# Patient Record
Sex: Male | Born: 1990 | Race: White | Hispanic: No | Marital: Single | Smoking: Never smoker
Health system: Southern US, Community
[De-identification: ages and names within clinical notes are randomized; demographics above are authoritative.]

## PROBLEM LIST (undated history)

## (undated) ENCOUNTER — Emergency Department (HOSPITAL_BASED_OUTPATIENT_CLINIC_OR_DEPARTMENT_OTHER): Payer: Self-pay

## (undated) DIAGNOSIS — K37 Unspecified appendicitis: Secondary | ICD-10-CM

## (undated) HISTORY — PX: APPENDECTOMY: SHX54

## (undated) HISTORY — PX: TONSILLECTOMY: SUR1361

## (undated) HISTORY — DX: Unspecified appendicitis: K37

---

## 2014-07-16 ENCOUNTER — Ambulatory Visit: Payer: BLUE CROSS/BLUE SHIELD

## 2014-07-19 ENCOUNTER — Ambulatory Visit (INDEPENDENT_AMBULATORY_CARE_PROVIDER_SITE_OTHER): Payer: BLUE CROSS/BLUE SHIELD

## 2014-07-19 ENCOUNTER — Ambulatory Visit (INDEPENDENT_AMBULATORY_CARE_PROVIDER_SITE_OTHER): Payer: BLUE CROSS/BLUE SHIELD | Admitting: Family Medicine

## 2014-07-19 VITALS — BP 138/76 | HR 88 | Temp 98.2°F | Resp 16 | Ht 72.0 in | Wt 278.0 lb

## 2014-07-19 DIAGNOSIS — Z0000 Encounter for general adult medical examination without abnormal findings: Secondary | ICD-10-CM

## 2014-07-19 DIAGNOSIS — M545 Low back pain, unspecified: Secondary | ICD-10-CM

## 2014-07-19 DIAGNOSIS — D224 Melanocytic nevi of scalp and neck: Secondary | ICD-10-CM

## 2014-07-19 DIAGNOSIS — F42 Obsessive-compulsive disorder: Secondary | ICD-10-CM

## 2014-07-19 DIAGNOSIS — Z23 Encounter for immunization: Secondary | ICD-10-CM

## 2014-07-19 DIAGNOSIS — F909 Attention-deficit hyperactivity disorder, unspecified type: Secondary | ICD-10-CM | POA: Insufficient documentation

## 2014-07-19 DIAGNOSIS — F429 Obsessive-compulsive disorder, unspecified: Secondary | ICD-10-CM | POA: Insufficient documentation

## 2014-07-19 DIAGNOSIS — E663 Overweight: Secondary | ICD-10-CM

## 2014-07-19 DIAGNOSIS — Z130 Encounter for screening for diseases of the blood and blood-forming organs and certain disorders involving the immune mechanism: Secondary | ICD-10-CM

## 2014-07-19 DIAGNOSIS — F411 Generalized anxiety disorder: Secondary | ICD-10-CM

## 2014-07-19 DIAGNOSIS — Z131 Encounter for screening for diabetes mellitus: Secondary | ICD-10-CM

## 2014-07-19 DIAGNOSIS — Z13220 Encounter for screening for lipoid disorders: Secondary | ICD-10-CM

## 2014-07-19 LAB — COMPREHENSIVE METABOLIC PANEL
ALT: 23 U/L (ref 0–53)
AST: 23 U/L (ref 0–37)
Albumin: 4.3 g/dL (ref 3.5–5.2)
Alkaline Phosphatase: 60 U/L (ref 39–117)
BILIRUBIN TOTAL: 0.7 mg/dL (ref 0.2–1.2)
BUN: 23 mg/dL (ref 6–23)
CHLORIDE: 106 meq/L (ref 96–112)
CO2: 27 mEq/L (ref 19–32)
Calcium: 9.4 mg/dL (ref 8.4–10.5)
Creat: 0.96 mg/dL (ref 0.50–1.35)
GLUCOSE: 79 mg/dL (ref 70–99)
POTASSIUM: 4.5 meq/L (ref 3.5–5.3)
Sodium: 140 mEq/L (ref 135–145)
Total Protein: 7.2 g/dL (ref 6.0–8.3)

## 2014-07-19 LAB — LDL CHOLESTEROL, DIRECT: Direct LDL: 102 mg/dL — ABNORMAL HIGH

## 2014-07-19 MED ORDER — MELOXICAM 15 MG PO TABS: 15.0000 mg | ORAL_TABLET | Freq: Every day | ORAL | Status: DC

## 2014-07-19 NOTE — Progress Notes (Addendum)
[facility] [address] [place] [address] [phone]  Date:  07/19/2014   Name:  James Peters   DOB:  07-20-1990   MRN:  [id number]  PCP:  No PCP Per Patient    Chief Complaint: Annual Exam   History of Present Illness:  James Peters is a 24 y.o. very pleasant male patient who presents with the following:  He is here today as a new patient- he would like a check up and to look at some moles, and to be set up with psychiatry. He has a mole on his left scalp that has been there "a long time"- it was under his hair which started to thin at age 78, he now wears a shaved hair style and this mole is more apparent.  He has never had any skin cancer himself, but there is some in his family.  He has not noted any change in the mole He is currently seeing a psychiatrist in [place]- however as he has moved here he would like to establish with someone here.  He is treated for ADHD, anxiety, OCD He works at [employer] and sometimes has some back pain after working. He has to lift a fair amount and admits he is not in very good shape.   He has had some trouble with back pain since he was in HS.  He does not have any leg pain, no weakness or numbness.    He is not fasting today.   He declines STI testing.  We will do a tetanus shot today Flu shot done already  There are no active problems to display for this patient.   History reviewed. No pertinent past medical history.  Past Surgical History  Procedure Laterality Date  . Appendectomy      History  Substance Use Topics  . Smoking status: Never Smoker   . Smokeless tobacco: Not on file  . Alcohol Use: Not on file    History reviewed. No pertinent family history.  No Known Allergies  Medication list has been reviewed and updated.  No current outpatient prescriptions on file prior to visit.   No current facility-administered medications on file prior to visit.    Review of Systems:  As per HPI-  otherwise negative.   Physical Examination: Filed Vitals:   07/19/14 1100  BP: 138/76  Pulse: 88  Temp: 98.2 F (36.8 C)  Resp: 16   Filed Vitals:   07/19/14 1100  Height: 6' (1.829 m)  Weight: 278 lb (126.1 kg)   Body mass index is 37.7 kg/(m^2). Ideal Body Weight: Weight in (lb) to have BMI = 25: 183.9  GEN: WDWN, NAD, Non-toxic, A & O x 3, obese, looks well HEENT: Atraumatic, Normocephalic. Neck supple. No masses, No LAD.  Bilateral TM wnl, oropharynx normal.  PEERL,EOMI.   There is a mole over pencil eraser sized with irregular border on his left anterior scalp.   Ears and Nose: No external deformity. CV: RRR, No M/G/R. No JVD. No thrill. No extra heart sounds. PULM: CTA B, no wheezes, crackles, rhonchi. No retractions. No resp. distress. No accessory muscle use. ABD: S, NT, ND, +BS. No rebound. No HSM. EXTR: No c/c/e NEURO Normal gait.  PSYCH: Normally interactive. Conversant. Not depressed or anxious appearing.  Calm demeanor.  He notes mild tenderness over his lower back.  Normal BLE strength, sensation and DTR, negative SLR  [facility] reading (PRIMARY) by  Dr. Lorelei Pont. Lumbar spine: negative  LUMBAR SPINE -  COMPLETE 4+ VIEW  COMPARISON: None.  FINDINGS: Bilateral L5 spondylolysis. No spondylolisthesis. Normal alignment. Normal mineralization. Paraspinal soft tissues normal.  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 Normal alignment.  Assessment and Plan: Physical exam  Nevus of scalp - Plan: Ambulatory referral to Dermatology  Generalized anxiety disorder - Plan: Ambulatory referral to Psychiatry  Screening for hyperlipidemia - Plan: LDL cholesterol, direct  Screening for diabetes mellitus - Plan: Comprehensive metabolic panel  Screening for deficiency anemia - Plan: CBC  Immunization due - Plan: Tdap vaccine greater than or equal to 7yo IM  Midline low back pain without sciatica - Plan: DG Lumbar Spine Complete, meloxicam (MOBIC)  15 MG tablet, DISCONTINUED: meloxicam (MOBIC) 15 MG tablet  CPE as above, await labs and will follow-up with him Referral to derm and to psychiatry Encouraged back/ core strengthening and general conditioning.  He can use mobic as needed.  Declines PT for now- he will let me know if not getting better  Signed Lamar Blinks, MD  Called 1/15 and [facility],.  Labs look good and I will send him a copy of labs.  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  He should work on weight loss and fitness but avoid all types of back extension exercises.  Will try back later to discuss  Called 1/17- he was actually aware of the spondylolysis, was diagnosed when he was a teen and he does not have any other questions about this.  Will send him a copy of his labs and x-ray report, as well as a short pt centered article regarding spondylolysis

## 2014-07-19 NOTE — Patient Instructions (Addendum)
I will be in touch with your labs asap We will set you up to see dermatology- let me know if you do not hear about this appt Please contact the psychiatrist of your choice and make an appt.  Here are a couple of suggestions  Dr. Talmage Coin, MD ?  Address: [address] [place], [place] [address]  [phone]  [facility] ?  Address: [address] [place], [place] [address]  [phone]  Your back films look good.  I would suggest that you work on your diet and exercise to try and drop some pounds/ strengthen your core.   Let me know if your back does not improve and try the mobic as needed for pain

## 2014-07-20 ENCOUNTER — Encounter: Payer: Self-pay | Admitting: Family Medicine

## 2014-07-20 LAB — CBC
HCT: 42.1 % (ref 39.0–52.0)
HEMOGLOBIN: 14.8 g/dL (ref 13.0–17.0)
MCH: 30.6 pg (ref 26.0–34.0)
MCHC: 35.2 g/dL (ref 30.0–36.0)
MCV: 87.2 fL (ref 78.0–100.0)
MPV: 10.5 fL (ref 8.6–12.4)
PLATELETS: 239 10*3/uL (ref 150–400)
RBC: 4.83 MIL/uL (ref 4.22–5.81)
RDW: 12.2 % (ref 11.5–15.5)
WBC: 4.4 10*3/uL (ref 4.0–10.5)

## 2015-03-19 ENCOUNTER — Ambulatory Visit (INDEPENDENT_AMBULATORY_CARE_PROVIDER_SITE_OTHER): Payer: 59 | Admitting: Psychiatry

## 2015-03-19 ENCOUNTER — Encounter (HOSPITAL_COMMUNITY): Payer: Self-pay | Admitting: Psychiatry

## 2015-03-19 VITALS — BP 141/79 | HR 100 | Ht 72.0 in | Wt 300.0 lb

## 2015-03-19 DIAGNOSIS — F900 Attention-deficit hyperactivity disorder, predominantly inattentive type: Secondary | ICD-10-CM | POA: Diagnosis not present

## 2015-03-19 DIAGNOSIS — F411 Generalized anxiety disorder: Secondary | ICD-10-CM

## 2015-03-19 DIAGNOSIS — F419 Anxiety disorder, unspecified: Secondary | ICD-10-CM | POA: Diagnosis not present

## 2015-03-19 MED ORDER — CLONIDINE HCL 0.1 MG PO TABS: 0.1000 mg | ORAL_TABLET | Freq: Every day | ORAL | Status: DC

## 2015-03-19 MED ORDER — PAROXETINE HCL 10 MG PO TABS: 10.0000 mg | ORAL_TABLET | Freq: Every day | ORAL | Status: DC

## 2015-03-19 MED ORDER — CLONAZEPAM 1 MG PO TABS: 1.0000 mg | ORAL_TABLET | Freq: Two times a day (BID) | ORAL | Status: DC | PRN

## 2015-03-19 MED ORDER — METHYLPHENIDATE HCL ER (OSM) 36 MG PO TBCR: 36.0000 mg | EXTENDED_RELEASE_TABLET | ORAL | Status: DC

## 2015-03-19 NOTE — Progress Notes (Signed)
[facility] Behavioral Health Initial Assessment Note  James Peters [id number] 24 y.o.  03/19/2015 9:59 AM  Chief Complaint:  I need my medication.  I was diagnosed with ADHD and OCD.  I have been taking this medication for more than 20 years.  My psychiatrist is out of my network.  History of Present Illness:  James Peters is 24 year old, Caucasian, single, employed man who is self-referred seeking management office psychiatric illness.  He's been diagnosed with ADHD and anxiety disorder and he's been taking medication for ADHD for more than 20 years.  He had formal psychological testing when he was in the school because he has difficulty doing multitasking, inattentive, hyperactivity and he was given a stimulant which has been working very well.  In recent years he remember having a lot of anxiety symptoms when he worried about his health, future, and having racing thoughts, insomnia and feeling overwhelmed.  Though he was diagnosed with OCD but do not recall any obsessive or ritual thoughts.  He was given Klonopin and Paxil few years ago by his psychiatrist in [place] and since then his symptoms are under control.  Lately he was seeing Debbora Dus however he switched his job and his insurance does not cover his current psychiatrist.  James Peters endorse though his anxiety and ADHD symptoms are under control but he is still have some time poor sleep, racing thoughts, excessive worrying and lack of energy.  He is able to do his task but he still struggle some time doing multitasking.  His attention and focus is okay.  He has gained weight because he is not watching his calorie intake or doing any regular exercise.  James Peters denies any feeling of hopelessness or worthlessness.  He denies any paranoia, hallucination, mania or any psychosis.  He is open to reduce the dose of stimulant since.  Discuss that stimulant may cause insomnia.  Since taking his medication he denies any major panic attack but admitted  sometime feeling overwhelmed.  He denies any impulsive behavior, OCD symptoms, nightmares, flashback or any PTSD symptoms.  He has no tremors or shakes.  He admitted social drinking but denies any binge or any intoxication.  James Peters lives with the roommate.  His family lives in [place].  James Peters has a close contact with his parents and his sister.  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  He likes his job and he does not like traveling every day 40-45 minutes.  He is open to her just is a stimulant dosage.  Suicidal Ideation: No Plan Formed: No James Peters has means to carry out plan: No  Homicidal Ideation: No Plan Formed: No James Peters has means to carry out plan: No  Past Psychiatric History/Hospitalization(s): James Peters diagnosed with ADHD when he was in school.  He has formal psychological testing.  He's been taking stimulant since then and in recent years he developed anxiety symptoms and he was given Klonopin and Paxil.  James Peters denies any history of psychiatric inpatient treatment, paranoia, hallucination, suicidal attempt, mania or any psychosis. Anxiety: Yes Bipolar Disorder: No Depression: Yes Mania: No Psychosis: No Schizophrenia: No Personality Disorder: No Hospitalization for psychiatric illness: No History of Electroconvulsive Shock Therapy: No Prior Suicide Attempts: No  Medical History; James Peters has no active medical problem.  He denies any history of seizures, headaches.  His primary care physician is Dr. Silvestre Mesi.  He had blood work in January 2016.  He has borderline LDL.  Traumatic brain injury: James Peters denies any history of  traumatic brain injury.  Family History; James Peters endorse her grandmother has diagnosed with schizophrenia.  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  He remember his grades are either average or above average.  He is currently working as a Therapist, occupational in a call center for [employer].  Psychosocial History; James Peters born and raised in [place].  His parents are married and living together.  James Peters has a very good relationship with his parents and his sister who lives in [place].  James Peters is single and currently not in any relationship.  He has no children.  He is living with roommates.  Legal History; James Peters denies any legal issues.  History Of Abuse; James Peters denies any history of abuse.  Substance Abuse History; James Peters admitted history of social drinking which is one beer and one to 2 months.  He denies drinking heavily, intoxication, blackouts or any seizures.  He denies any illegal substance use.    Review of Systems: Psychiatric: Agitation: No Hallucination: No Depressed Mood: No Insomnia: Yes Hypersomnia: No Altered Concentration: No Feels Worthless: No Grandiose Ideas: No Belief In Special Powers: No New/Increased Substance Abuse: No Compulsions: No  Neurologic: Headache: No Seizure: No Paresthesias: No   Outpatient Encounter Prescriptions as of 03/19/2015  Medication Sig  . clonazePAM (KLONOPIN) 1 MG tablet Take 1 tablet (1 mg total) by mouth 2 (two) times daily as needed for anxiety.  . cloNIDine (CATAPRES) 0.1 MG tablet Take 1 tablet (0.1 mg total) by mouth at bedtime.  . methylphenidate 36 MG PO CR tablet Take 1 tablet (36 mg total) by mouth every morning.  Marland Kitchen PARoxetine (PAXIL) 10 MG tablet Take 1 tablet (10 mg total) by mouth daily.  . [DISCONTINUED] clonazePAM (KLONOPIN) 1 MG tablet Take 1 mg by mouth 2 (two) times daily.  . [DISCONTINUED] cloNIDine (CATAPRES) 0.1 MG tablet Take 0.1 mg by mouth 2 (two) times daily.  . [DISCONTINUED] meloxicam (MOBIC) 15 MG tablet Take 1 tablet (15 mg total) by mouth daily. Use as needed for back pain (James Peters not taking: Reported on 03/19/2015)  . [DISCONTINUED] methylphenidate 54 MG PO CR tablet Take 54 mg by mouth every morning.  . [DISCONTINUED] PARoxetine (PAXIL) 10 MG tablet Take 10  mg by mouth daily.   No facility-administered encounter medications on file as of 03/19/2015.    No results found for this or any previous visit (from the past 2160 hour(s)).    Constitutional:  BP 141/79 mmHg  Pulse 100  Ht 6' (1.829 m)  Wt 300 lb (136.079 kg)  BMI 40.68 kg/m2   Musculoskeletal: Strength & Muscle Tone: within normal limits Gait & Station: normal James Peters leans: N/A  Psychiatric Specialty Exam: General Appearance: Casual  Eye Contact::  Fair  Speech:  Normal Rate  Volume:  Normal  Mood:  Anxious  Affect:  Appropriate  Thought Process:  Coherent and Goal Directed  Orientation:  Full (Time, Place, and Person)  Thought Content:  WDL  Suicidal Thoughts:  No  Homicidal Thoughts:  No  Memory:  Immediate;   Good Recent;   Good Remote;   Good  Judgement:  Good  Insight:  Good  Psychomotor Activity:  Normal  Concentration:  Good  Recall:  Good  Fund of Knowledge:  Good  Language:  Good  Akathisia:  No  Handed:  Right  AIMS (if indicated):     Assets:  Communication Skills Desire for Improvement Financial Resources/Insurance [place] Talents/Skills Transportation  ADL's:  Intact  Cognition:  WNL  Sleep:        Established Problem, Stable/Improving (1), New problem, with additional work up planned, Review of Psycho-Social Stressors (1), Review or order clinical lab tests (1), Review and summation of old records (2), New Problem, with no additional work-up planned (3), Independent Review of image, tracing or specimen (2) and Review of Medication Regimen & Side Effects (2)  Assessment: Axis I: ADHD, inattentive type.  Anxiety disorder NOS  Axis II: Deferred  Axis III:  History reviewed. No pertinent past medical history.   Plan:  I review her symptoms, history, current medication, blood work results and collateral information.  He is taking Concerta 54 mg every day, clonidine 0.1 milligram at bedtime,  Klonopin 1 mg twice a day and Paxil 10 mg daily.  I had a long discussion with the James Peters about dosage, indication, interaction and side effects of medication.  We discuss stimulant cause anxiety symptoms and insomnia.  Recommended to decrease stimulant dose which may help to reduce taking his Klonopin and eventually he may come off from Paxil.  James Peters is willing to try low-dose Concerta.  He will start 36 mg daily and he will also use Klonopin 0.5-1 mg as needed twice a day.  For now he will continue Klonopin 0.1 mg at bedtime and Paxil 10 mg daily but we will consider reducing the dose on his next appointment.  He also encouraged to watch his calorie intake, and to regular exercise.  Discussed medication side effects.  At this time James Peters does not want counseling however agree if needed that he will call us back.  Discuss safety plan that anytime having active suicidal thoughts or homicidal thoughts and he need to call 911 or go to the local emergency room.  I will see him again in 3 weeks.  We will get records from [facility] for collateral information.  Opaline Reyburn T., MD 03/19/2015

## 2015-04-10 ENCOUNTER — Ambulatory Visit (INDEPENDENT_AMBULATORY_CARE_PROVIDER_SITE_OTHER): Payer: 59 | Admitting: Psychiatry

## 2015-04-10 ENCOUNTER — Encounter (HOSPITAL_COMMUNITY): Payer: Self-pay | Admitting: Psychiatry

## 2015-04-10 VITALS — BP 128/78 | HR 96 | Ht 72.0 in | Wt 307.6 lb

## 2015-04-10 DIAGNOSIS — F411 Generalized anxiety disorder: Secondary | ICD-10-CM

## 2015-04-10 DIAGNOSIS — F900 Attention-deficit hyperactivity disorder, predominantly inattentive type: Secondary | ICD-10-CM

## 2015-04-10 MED ORDER — METHYLPHENIDATE HCL ER (OSM) 36 MG PO TBCR: 36.0000 mg | EXTENDED_RELEASE_TABLET | ORAL | Status: DC

## 2015-04-10 MED ORDER — CLONIDINE HCL 0.1 MG PO TABS: 0.1000 mg | ORAL_TABLET | Freq: Every day | ORAL | Status: DC

## 2015-04-10 MED ORDER — PAROXETINE HCL 10 MG PO TABS: 10.0000 mg | ORAL_TABLET | Freq: Every day | ORAL | Status: DC

## 2015-04-10 MED ORDER — CLONAZEPAM 1 MG PO TABS: 1.0000 mg | ORAL_TABLET | Freq: Two times a day (BID) | ORAL | Status: DC | PRN

## 2015-04-10 NOTE — Progress Notes (Signed)
James Peters Progress Note  James Peters [id number] 24 y.o.  04/10/2015 9:17 AM  Chief Complaint:  I cut down Concerta and it is working fine.  My ADHD symptoms are under control.  I still have anxiety.    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  He has diagnoses of ADHD, anxiety disorder and OCD.  He is taking medication for more than 20 years .  He was seeing Debbora Dus and recently patient switch his job and his insurance does not cover him.  He was taking Concerta 54 mg, clonidine 0.1 mg at bedtime, Klonopin 1 mg twice a day and Paxil 10 mg daily.  He was awaiting of poor sleep, racing thoughts, excessive worrying and anxiety.  We have recommended to cut down his Concerta and now he is taking low dose Concerta.  He seen much improvement in his sleep, racing thoughts but he still have anxiety .  He tried cutting down his Klonopin and there are times when he takes half tablet on the weekend but get very anxious and nervous.  Recently he moved from [place] to [place] closer to his job.  He is living by himself.  He is very happy because he does not like living with roommates.  He is able to do multitasking.  Recently his employer given him more job and task.  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  He has no tremors, shakes, irritability or any nightmares.  He does not have any OCD symptoms.  He wants to keep Paxil for now because it is working his anxiety and Klonopin 1 mg twice a day.  We are still awaiting collateral information from Debbora Dus who was his previous psychiatrist.  Patient denies drinking or using any illegal substances.  He has not joined the [facility] yet but planning in few days.  His appetite is okay.  He has gained some weight from the past and is hoping to watch his calories in  the future.    Suicidal Ideation: No Plan Formed: No Patient has means to carry out plan: No  Homicidal Ideation: No Plan Formed: No Patient has means to carry out plan: No  Past Psychiatric History/Hospitalization(s): Patient diagnosed with ADHD when he was in school.  He has formal psychological testing.  He is taking stimulant since his his school age.  In the past few years he developed anxiety symptoms and he was given Klonopin and Paxil.  He was seeing Debbora Dus .  Patient denies any history of psychiatric inpatient treatment, paranoia, hallucination, suicidal attempt, mania or any psychosis. Anxiety: Yes Bipolar Disorder: No Depression: Yes Mania: No Psychosis: No Schizophrenia: No Personality Disorder: No Hospitalization for psychiatric illness: No History of Electroconvulsive Shock Therapy: No Prior Suicide Attempts: No  Medical History; Patient has no active medical problem.  He denies any history of seizures, headaches.  His primary care physician is Dr. Silvestre Mesi.  He had blood work in January 2016.  He has borderline LDL.  Family History; Patient endorse her grandmother has diagnosed with schizophrenia.  Psychosocial History; Patient born and raised in [place].  His parents are married and living together.  Patient has a very good relationship with his parents and his sister who lives in [place].  Patient is single and currently not  in any relationship.  He has no children.  He lives by himself.    Substance Abuse History; Patient admitted history of social drinking which is one beer and one to 2 months.  He denies drinking heavily, intoxication, blackouts or any seizures.  He denies any illegal substance use.    Review of Systems  Constitutional: Negative for weight loss.  Cardiovascular: Negative for chest pain and palpitations.  Musculoskeletal: Negative.   Skin: Negative for itching and rash.  Neurological: Negative for dizziness, tingling,  tremors and headaches.  Psychiatric/Behavioral: Negative.     Psychiatric: Agitation: No Hallucination: No Depressed Mood: No Insomnia: No Hypersomnia: No Altered Concentration: No Feels Worthless: No Grandiose Ideas: No Belief In Special Powers: No New/Increased Substance Abuse: No Compulsions: No  Neurologic: Headache: No Seizure: No Paresthesias: No   Outpatient Encounter Prescriptions as of 04/10/2015  Medication Sig  . clonazePAM (KLONOPIN) 1 MG tablet Take 1 tablet (1 mg total) by mouth 2 (two) times daily as needed for anxiety.  . cloNIDine (CATAPRES) 0.1 MG tablet Take 1 tablet (0.1 mg total) by mouth at bedtime.  . methylphenidate 36 MG PO CR tablet Take 1 tablet (36 mg total) by mouth every morning.  Marland Kitchen PARoxetine (PAXIL) 10 MG tablet Take 1 tablet (10 mg total) by mouth daily.  . [DISCONTINUED] clonazePAM (KLONOPIN) 1 MG tablet Take 1 tablet (1 mg total) by mouth 2 (two) times daily as needed for anxiety.  . [DISCONTINUED] cloNIDine (CATAPRES) 0.1 MG tablet Take 1 tablet (0.1 mg total) by mouth at bedtime.  . [DISCONTINUED] methylphenidate 36 MG PO CR tablet Take 1 tablet (36 mg total) by mouth every morning.  . [DISCONTINUED] methylphenidate 36 MG PO CR tablet Take 1 tablet (36 mg total) by mouth every morning.  . [DISCONTINUED] PARoxetine (PAXIL) 10 MG tablet Take 1 tablet (10 mg total) by mouth daily.   No facility-administered encounter medications on file as of 04/10/2015.    No results found for this or any previous visit (from the past 2160 hour(s)).    Constitutional:  BP 128/78 mmHg  Pulse 96  Ht 6' (1.829 m)  Wt 307 lb 9.6 oz (139.526 kg)  BMI 41.71 kg/m2   Musculoskeletal: Strength & Muscle Tone: within normal limits Gait & Station: normal Patient leans: N/A  Psychiatric Specialty Exam: General Appearance: Casual  Eye Contact::  Fair  Speech:  Normal Rate  Volume:  Normal  Mood:  Euthymic  Affect:  Appropriate  Thought Process:   Coherent and Goal Directed  Orientation:  Full (Time, Place, and Person)  Thought Content:  WDL  Suicidal Thoughts:  No  Homicidal Thoughts:  No  Memory:  Immediate;   Good Recent;   Good Remote;   Good  Judgement:  Good  Insight:  Good  Psychomotor Activity:  Normal  Concentration:  Good  Recall:  Good  Fund of Knowledge:  Good  Language:  Good  Akathisia:  No  Handed:  Right  AIMS (if indicated):     Assets:  Communication Skills Desire for Improvement Financial Resources/Insurance Housing Physical Health Resilience Social Support Talents/Skills Transportation  ADL's:  Intact  Cognition:  WNL  Sleep:        Established Problem, Stable/Improving (1), Review of Psycho-Social Stressors (1), Decision to obtain old records (1), Review and summation of old records (2), Review of Last Therapy Session (1), Review of Medication Regimen & Side Effects (2) and Review of New Medication or Change in Dosage (2)  Assessment: Axis  I: ADHD, inattentive type.  Anxiety disorder NOS  Axis II: Deferred  Axis III:  History reviewed. No pertinent past medical history.   Plan:  We are still awaiting collateral information from his previous provider.  He likes low-dose Concerta .  He tried cutting down Klonopin but his anxiety get worse.  Discussed medication side effects and benefits.  Recommended to continue to try Klonopin half to 1 mg twice a day as needed to avoid building tolerance and dependency.  Discuss in detail benzodiazepine dependence, withdrawal and tolerance.  Continue Concerta 36 mg daily.  Continue clonidine 0.1 mg at bedtime and Paxil 10 mg daily.  Patient does not want to stop Paxil for now because of residual anxiety symptoms.  Discussed medication side effects and benefits in detail.  Encouraged to start [employer] and to watch his calorie intake .  Patient has gained weight from the past and he is aware and will work on his goals his weight.  Patient denies any side effects  including any shakes, tremors, headaches or any insomnia.  Recommended to call us back if he has any question or any concern.  Follow-up in 2 months.  Discuss safety plan that anytime having active suicidal thoughts or homicidal thoughts then 80 to call 911 or go to the local emergency room.  ARFEEN,SYED T., MD 04/10/2015

## 2015-05-27 ENCOUNTER — Encounter: Payer: Self-pay | Admitting: Emergency Medicine

## 2015-05-27 ENCOUNTER — Ambulatory Visit (INDEPENDENT_AMBULATORY_CARE_PROVIDER_SITE_OTHER): Payer: 59 | Admitting: Emergency Medicine

## 2015-05-27 VITALS — BP 134/84 | HR 112 | Temp 98.1°F | Resp 20 | Ht 73.0 in | Wt 313.8 lb

## 2015-05-27 DIAGNOSIS — J0140 Acute pansinusitis, unspecified: Secondary | ICD-10-CM

## 2015-05-27 MED ORDER — PSEUDOEPHEDRINE-GUAIFENESIN ER 60-600 MG PO TB12: 1.0000 | ORAL_TABLET | Freq: Two times a day (BID) | ORAL | Status: DC

## 2015-05-27 MED ORDER — AMOXICILLIN-POT CLAVULANATE 875-125 MG PO TABS: 1.0000 | ORAL_TABLET | Freq: Two times a day (BID) | ORAL | Status: DC

## 2015-05-27 NOTE — Progress Notes (Signed)
Subjective:  Patient ID: James Peters, male    DOB: 12-28-1990  Age: 24 y.o. MRN: [id number]  CC: Sinusitis   HPI James Peters presents  congestion postnasal drainage and a purulent nasal drainage. No cough wheezing or shortness of breath. No nausea vomiting. No stool change. No rash. No improvement with over-the-counter medication been ill for the last 3 days. He has a rather persistent frontal headache and pressure in his cheeks.  History James Peters has no past medical history on file.   He has past surgical history that includes Appendectomy and Tonsillectomy.   His  family history is not on file.  He   reports that he has never smoked. He does not have any smokeless tobacco history on file. His alcohol and drug histories are not on file.  Outpatient Prescriptions Prior to Visit  Medication Sig Dispense Refill  . clonazePAM (KLONOPIN) 1 MG tablet Take 1 tablet (1 mg total) by mouth 2 (two) times daily as needed for anxiety. 60 tablet 1  . cloNIDine (CATAPRES) 0.1 MG tablet Take 1 tablet (0.1 mg total) by mouth at bedtime. 30 tablet 1  . methylphenidate 36 MG PO CR tablet Take 1 tablet (36 mg total) by mouth every morning. 30 tablet 0  . PARoxetine (PAXIL) 10 MG tablet Take 1 tablet (10 mg total) by mouth daily. 30 tablet 1   No facility-administered medications prior to visit.    Social History   Social History  . Marital Status: Single    Spouse Name: N/A  . Number of Children: N/A  . Years of Education: N/A   Social History Main Topics  . Smoking status: Never Smoker   . Smokeless tobacco: None  . Alcohol Use: None  . Drug Use: None  . Sexual Activity: Not Asked   Other Topics Concern  . None   Social History Narrative     Review of Systems  Constitutional: Positive for fatigue. Negative for fever, chills and appetite change.  HENT: Positive for congestion, postnasal drip and rhinorrhea. Negative for ear pain, sinus pressure and sore throat.   Eyes:  Negative for pain and redness.  Respiratory: Negative for cough, shortness of breath and wheezing.   Cardiovascular: Negative for leg swelling.  Gastrointestinal: Negative for nausea, vomiting, abdominal pain, diarrhea, constipation and blood in stool.  Endocrine: Negative for polyuria.  Genitourinary: Negative for dysuria, urgency, frequency and flank pain.  Musculoskeletal: Negative for gait problem.  Skin: Negative for rash.  Neurological: Positive for headaches. Negative for weakness.  Psychiatric/Behavioral: Negative for confusion and decreased concentration. The patient is not nervous/anxious.     Objective:  BP 134/84 mmHg  Pulse 112  Temp(Src) 98.1 F (36.7 C) (Oral)  Resp 20  Ht 6\' 1"  (1.854 m)  Wt 313 lb 12.8 oz (142.339 kg)  BMI 41.41 kg/m2  SpO2 97%  Physical Exam  Constitutional: He is oriented to person, place, and time. He appears well-developed and well-nourished.  HENT:  Head: Normocephalic and atraumatic.  Eyes: Conjunctivae are normal. Pupils are equal, round, and reactive to light.  Pulmonary/Chest: Effort normal.  Musculoskeletal: He exhibits no edema.  Neurological: He is alert and oriented to person, place, and time.  Skin: Skin is dry.  Psychiatric: He has a normal mood and affect. His behavior is normal. Thought content normal.      Assessment & Plan:   James Peters was seen today for sinusitis.  Diagnoses and all orders for this visit:  Acute pansinusitis, recurrence not specified  Other orders -     amoxicillin-clavulanate (AUGMENTIN) 875-125 MG tablet; Take 1 tablet by mouth 2 (two) times daily. -     pseudoephedrine-guaifenesin (MUCINEX D) 60-600 MG 12 hr tablet; Take 1 tablet by mouth every 12 (twelve) hours.  I am having James Peters start on amoxicillin-clavulanate and pseudoephedrine-guaifenesin. I am also having him maintain his cloNIDine, PARoxetine, clonazePAM, and methylphenidate.  Meds ordered this encounter  Medications  .  amoxicillin-clavulanate (AUGMENTIN) 875-125 MG tablet    Sig: Take 1 tablet by mouth 2 (two) times daily.    Dispense:  20 tablet    Refill:  0  . pseudoephedrine-guaifenesin (MUCINEX D) 60-600 MG 12 hr tablet    Sig: Take 1 tablet by mouth every 12 (twelve) hours.    Dispense:  18 tablet    Refill:  0    Appropriate red flag conditions were discussed with the patient as well as actions that should be taken.  Patient expressed his understanding.  Follow-up: Return if symptoms worsen or fail to improve.  Roselee Culver, MD

## 2015-05-27 NOTE — Patient Instructions (Signed)

## 2015-06-11 ENCOUNTER — Ambulatory Visit (INDEPENDENT_AMBULATORY_CARE_PROVIDER_SITE_OTHER): Payer: 59 | Admitting: Psychiatry

## 2015-06-11 ENCOUNTER — Encounter (HOSPITAL_COMMUNITY): Payer: Self-pay | Admitting: Psychiatry

## 2015-06-11 VITALS — BP 127/84 | HR 88 | Ht 73.0 in | Wt 310.6 lb

## 2015-06-11 DIAGNOSIS — F411 Generalized anxiety disorder: Secondary | ICD-10-CM | POA: Diagnosis not present

## 2015-06-11 DIAGNOSIS — F900 Attention-deficit hyperactivity disorder, predominantly inattentive type: Secondary | ICD-10-CM | POA: Diagnosis not present

## 2015-06-11 MED ORDER — CLONAZEPAM 1 MG PO TABS: ORAL_TABLET | ORAL | Status: DC

## 2015-06-11 MED ORDER — METHYLPHENIDATE HCL ER (OSM) 36 MG PO TBCR: 36.0000 mg | EXTENDED_RELEASE_TABLET | ORAL | Status: DC

## 2015-06-11 MED ORDER — PAROXETINE HCL 20 MG PO TABS: 20.0000 mg | ORAL_TABLET | Freq: Every day | ORAL | Status: DC

## 2015-06-11 MED ORDER — CLONIDINE HCL 0.1 MG PO TABS: 0.1000 mg | ORAL_TABLET | Freq: Every day | ORAL | Status: DC

## 2015-06-11 NOTE — Progress Notes (Signed)
[facility] Progress Note  Marvelle Collinsworth [id number] 24 y.o.  06/11/2015 11:40 AM  Chief Complaint:  Medication management and follow-up.      History of Present Illness:  Gleen came for his follow-up appointment.  He tried to cut down Klonopin 1.5 mg a day but he gets sometimes very anxious and nervous.  Overall he is happy because his job is going very well.  He may get promoted in few weeks.  He had a good Thanksgiving.  He denies any major panic attack.  He denies any irritability, anger, mood swing.  He is able to do multitasking with his ADHD medication.  He denies any paranoia or any hallucination.  He lost 3 pounds since the last visit and is happy about it.  He started watching his diet and sometime he gets time that he do exercise and walking.  He started [facility] .  We are still awaiting collateral information from his previous provider.  Patient denies drinking or using any illegal substances.  He has no chest pain or any palpitation.  He does not ask for early refills of Klonopin.  Patient denies drinking or using any illegal substances.  He is hoping to spend Christmas with his family in [place].  Patient is working in [employer].  He lives by himself.Marland Kitchen  He is single and he has no children.  Suicidal Ideation: No Plan Formed: No Patient has means to carry out plan: No  Homicidal Ideation: No Plan Formed: No Patient has means to carry out plan: No  Past Psychiatric History/Hospitalization(s): Patient diagnosed with ADHD when he was in school.  He has formal psychological testing.  He is taking stimulant since his his school age.  In the past few years he developed anxiety symptoms and he was given Klonopin and Paxil.  He was seeing Debbora Dus .  Patient denies any history of psychiatric inpatient treatment, paranoia, hallucination, suicidal attempt, mania or any psychosis. Anxiety: Yes Bipolar Disorder: No Depression: Yes Mania: No Psychosis:  No Schizophrenia: No Personality Disorder: No Hospitalization for psychiatric illness: No History of Electroconvulsive Shock Therapy: No Prior Suicide Attempts: No  Medical History; Patient has no active medical problem.  He denies any history of seizures, headaches.  His primary care physician is Dr. Silvestre Mesi.  He had blood work in January 2016.  He has borderline LDL.  Family History; Patient endorse her grandmother has diagnosed with schizophrenia.  Substance Abuse History; Patient admitted history of social drinking which is one beer and one to 2 months.  He denies drinking heavily, intoxication, blackouts or any seizures.  He denies any illegal substance use.    Review of Systems  Constitutional: Positive for weight loss.  Cardiovascular: Negative for chest pain and palpitations.  Musculoskeletal: Negative.   Skin: Negative for itching and rash.  Neurological: Negative for dizziness, tingling, tremors and headaches.  Psychiatric/Behavioral: Negative.  Negative for depression, suicidal ideas and substance abuse.    Psychiatric: Agitation: No Hallucination: No Depressed Mood: No Insomnia: No Hypersomnia: No Altered Concentration: No Feels Worthless: No Grandiose Ideas: No Belief In Special Powers: No New/Increased Substance Abuse: No Compulsions: No  Neurologic: Headache: No Seizure: No Paresthesias: No   Outpatient Encounter Prescriptions as of 06/11/2015  Medication Sig  . amoxicillin-clavulanate (AUGMENTIN) 875-125 MG tablet Take 1 tablet by mouth 2 (two) times daily.  . clonazePAM (KLONOPIN) 1 MG tablet Take 1 tab daily and 1/2 at bed time  . cloNIDine (CATAPRES) 0.1 MG  tablet Take 1 tablet (0.1 mg total) by mouth at bedtime.  . methylphenidate 36 MG PO CR tablet Take 1 tablet (36 mg total) by mouth every morning.  Marland Kitchen PARoxetine (PAXIL) 20 MG tablet Take 1 tablet (20 mg total) by mouth daily.  . [DISCONTINUED] clonazePAM (KLONOPIN) 1 MG tablet Take 1  tablet (1 mg total) by mouth 2 (two) times daily as needed for anxiety.  . [DISCONTINUED] cloNIDine (CATAPRES) 0.1 MG tablet Take 1 tablet (0.1 mg total) by mouth at bedtime.  . [DISCONTINUED] methylphenidate 36 MG PO CR tablet Take 1 tablet (36 mg total) by mouth every morning.  . [DISCONTINUED] methylphenidate 36 MG PO CR tablet Take 1 tablet (36 mg total) by mouth every morning.  . [DISCONTINUED] PARoxetine (PAXIL) 10 MG tablet Take 1 tablet (10 mg total) by mouth daily.  . [DISCONTINUED] pseudoephedrine-guaifenesin (MUCINEX D) 60-600 MG 12 hr tablet Take 1 tablet by mouth every 12 (twelve) hours.   No facility-administered encounter medications on file as of 06/11/2015.    No results found for this or any previous visit (from the past 2160 hour(s)).    Constitutional:  BP 127/84 mmHg  Pulse 88  Ht 6\' 1"  (1.854 m)  Wt 310 lb 9.6 oz (140.887 kg)  BMI 40.99 kg/m2   Musculoskeletal: Strength & Muscle Tone: within normal limits Gait & Station: normal Patient leans: N/A  Psychiatric Specialty Exam: General Appearance: Casual  Eye Contact::  Fair  Speech:  Normal Rate  Volume:  Normal  Mood:  Euthymic  Affect:  Appropriate  Thought Process:  Coherent and Goal Directed  Orientation:  Full (Time, Place, and Person)  Thought Content:  WDL  Suicidal Thoughts:  No  Homicidal Thoughts:  No  Memory:  Immediate;   Good Recent;   Good Remote;   Good  Judgement:  Good  Insight:  Good  Psychomotor Activity:  Normal  Concentration:  Good  Recall:  Good  Fund of Knowledge:  Good  Language:  Good  Akathisia:  No  Handed:  Right  AIMS (if indicated):     Assets:  Communication Skills Desire for Improvement Financial Resources/Insurance Housing Physical Health Resilience Social Support Talents/Skills Transportation  ADL's:  Intact  Cognition:  WNL  Sleep:        Established Problem, Stable/Improving (1), Review of Psycho-Social Stressors (1), Decision to obtain old  records (1), Review and summation of old records (2), Review of Last Therapy Session (1), Review of Medication Regimen & Side Effects (2) and Review of New Medication or Change in Dosage (2)  Assessment: Axis I: ADHD, inattentive type.  Anxiety disorder NOS  Axis II: Deferred  Axis III:  History reviewed. No pertinent past medical history.   Plan:  We are still awaiting collateral information from his previous provider.  I recommended to try increasing Paxil 20 mg to help his anxiety and cut down his Klonopin to take 1 mg in the morning and half at bedtime.  Continue low-dose Concerta , clonidine .  Patient does not have any side effects at this time.  Discussed medication side effects and benefits in detail.  Encouraged to continue [employer] and to watch his calorie intake .  Recommended to call us back if he has any question or any concern.  Follow-up in 2 months.  Discuss safety plan that anytime having active suicidal thoughts or homicidal thoughts then 80 to call 911 or go to the local emergency room.  Dandrea Medders T., MD 06/11/2015

## 2015-08-21 ENCOUNTER — Ambulatory Visit (INDEPENDENT_AMBULATORY_CARE_PROVIDER_SITE_OTHER): Payer: 59 | Admitting: Psychiatry

## 2015-08-21 ENCOUNTER — Encounter (HOSPITAL_COMMUNITY): Payer: Self-pay | Admitting: Psychiatry

## 2015-08-21 VITALS — BP 118/80 | HR 89 | Ht 72.5 in | Wt 310.4 lb

## 2015-08-21 DIAGNOSIS — F411 Generalized anxiety disorder: Secondary | ICD-10-CM

## 2015-08-21 DIAGNOSIS — F900 Attention-deficit hyperactivity disorder, predominantly inattentive type: Secondary | ICD-10-CM

## 2015-08-21 MED ORDER — METHYLPHENIDATE HCL ER (OSM) 36 MG PO TBCR: 36.0000 mg | EXTENDED_RELEASE_TABLET | Freq: Every day | ORAL | Status: DC

## 2015-08-21 MED ORDER — CLONAZEPAM 1 MG PO TABS: ORAL_TABLET | ORAL | Status: DC

## 2015-08-21 MED ORDER — BUPROPION HCL ER (XL) 150 MG PO TB24: ORAL_TABLET | ORAL | Status: DC

## 2015-08-21 NOTE — Progress Notes (Signed)
James Peters [phone] Progress Note  [place] Rebeck [id number] 25 y.o.  08/21/2015 3:23 PM  Chief Complaint:  I tried increase Paxil but I still feel lack of motivation, depressed and sleeping too much during the day.        History of Present Illness:  James Peters came for his follow-up appointment.  On his last visit we recommended to increase Paxil because he was complaining of increased anxiety and nervousness.  However he does not feel any improvement.  He continued to endorse lack of energy, fatigue, motivation to do things.  He is taking Klonopin 0.5 mg the day and 1 mg at bedtime.  He sleeping okay with Klonopin.  He is taking Concerta in the morning but he does not feel it is helping as much.  He cannot afford Concerta and like to try generic medicine.  He is happy recently got promoted at work and stress level is somewhat better but he admitted not able to go to gym and has difficulty losing his weight.  He is still reading collateral information from his previous provider.  She denies drinking or using any illegal substances.  He denies any tremors shakes or any EPS.  He went to [place] for Christmas and he had a good time.  Patient is working for an IT consultant.  He lives by himself.  He is single he has no children.  His appetite is okay.  He is unable to lose weight.  Suicidal Ideation: No Plan Formed: No Patient has means to carry out plan: No  Homicidal Ideation: No Plan Formed: No Patient has means to carry out plan: No  Past Psychiatric History/Hospitalization(s): Patient diagnosed with ADHD when he was in school.  He has formal psychological testing.  He is taking stimulant since his his school age.  In the past few years he developed anxiety symptoms and he was given Klonopin and Paxil.  He was seeing Debbora Dus .  Patient denies any history of psychiatric inpatient treatment, paranoia, hallucination, suicidal attempt, mania or any psychosis. Anxiety: Yes Bipolar  Disorder: No Depression: Yes Mania: No Psychosis: No Schizophrenia: No Personality Disorder: No Hospitalization for psychiatric illness: No History of Electroconvulsive Shock Therapy: No Prior Suicide Attempts: No  Medical History; Patient has no active medical problem.  He denies any history of seizures, headaches.  His primary care physician is Dr. Silvestre Mesi.  He had blood work in January 2016.  He has borderline LDL.  Family History; Patient endorse her grandmother has diagnosed with schizophrenia.  Substance Abuse History; Patient admitted history of social drinking which is one beer and one to 2 months.  He denies drinking heavily, intoxication, blackouts or any seizures.  He denies any illegal substance use.    Review of Systems  Constitutional: Positive for malaise/fatigue.  Cardiovascular: Negative for chest pain and palpitations.  Musculoskeletal: Negative.   Skin: Negative for itching and rash.  Neurological: Negative for dizziness, tingling, tremors and headaches.  Psychiatric/Behavioral: Positive for depression. Negative for suicidal ideas and substance abuse.    Psychiatric: Agitation: No Hallucination: No Depressed Mood: Yes Insomnia: No Hypersomnia: No Altered Concentration: No Feels Worthless: No Grandiose Ideas: No Belief In Special Powers: No New/Increased Substance Abuse: No Compulsions: No  Neurologic: Headache: No Seizure: No Paresthesias: No   Outpatient Encounter Prescriptions as of 08/21/2015  Medication Sig  . buPROPion (WELLBUTRIN XL) 150 MG 24 hr tablet Take 1 tab daily in am for 1 week and than 2 tab daily  .  clonazePAM (KLONOPIN) 1 MG tablet Take 1 tab daily and 1/2 at bed time  . cloNIDine (CATAPRES) 0.1 MG tablet Take 1 tablet (0.1 mg total) by mouth at bedtime.  . methylphenidate (CONCERTA) 36 MG CR tablet Take 1 tablet (36 mg total) by mouth daily.  . [DISCONTINUED] amoxicillin-clavulanate (AUGMENTIN) 875-125 MG tablet Take 1  tablet by mouth 2 (two) times daily.  . [DISCONTINUED] clonazePAM (KLONOPIN) 1 MG tablet Take 1 tab daily and 1/2 at bed time  . [DISCONTINUED] methylphenidate 36 MG PO CR tablet Take 1 tablet (36 mg total) by mouth every morning.  . [DISCONTINUED] PARoxetine (PAXIL) 20 MG tablet Take 1 tablet (20 mg total) by mouth daily.   No facility-administered encounter medications on file as of 08/21/2015.    No results found for this or any previous visit (from the past 2160 hour(s)).    Constitutional:  BP 118/80 mmHg  Pulse 89  Ht 6' 0.5" (1.842 m)  Wt 310 lb 6.4 oz (140.797 kg)  BMI 41.50 kg/m2   Musculoskeletal: Strength & Muscle Tone: within normal limits Gait & Station: normal Patient leans: N/A  Psychiatric Specialty Exam: General Appearance: Casual  Eye Contact::  Fair  Speech:  Normal Rate  Volume:  Normal  Mood:  Anxious and Depressed  Affect:  Appropriate  Thought Process:  Coherent and Goal Directed  Orientation:  Full (Time, Place, and Person)  Thought Content:  WDL  Suicidal Thoughts:  No  Homicidal Thoughts:  No  Memory:  Immediate;   Good Recent;   Good Remote;   Good  Judgement:  Good  Insight:  Good  Psychomotor Activity:  Normal  Concentration:  Good  Recall:  Good  Fund of Knowledge:  Good  Language:  Good  Akathisia:  No  Handed:  Right  AIMS (if indicated):     Assets:  Communication Skills Desire for Improvement Financial Resources/Insurance Housing Physical Health Resilience Social Support Talents/Skills Transportation  ADL's:  Intact  Cognition:  WNL  Sleep:        Established Problem, Stable/Improving (1), Review of Psycho-Social Stressors (1), Decision to obtain old records (1), Review and summation of old records (2), Established Problem, Worsening (2), Review of Last Therapy Session (1), Review of Medication Regimen & Side Effects (2) and Review of New Medication or Change in Dosage (2)  Assessment: Axis I: ADHD, inattentive type.   Anxiety disorder NOS  Axis II: Deferred  Axis III:  History reviewed. No pertinent past medical history.   Plan:  I recommended to discontinue Paxil since it is not helping his depression .  He has a hard time losing weight.  We will try Wellbutrin XL 150 mg daily for 1 week and than 2 tablet daily.  I will also change his stimulant to generic so he can afford.  Continue methylphenidate 36 milligrams in the morning .  Continue Klonopin 0.5 mg in the morning and 1 at bedtime.  Discussed medication side effects and benefits. Encouraged to continue [employer] and to watch his calorie intake .  Recommended to call us back if he has any question or any concern.  Follow-up in 1 month.  Discuss safety plan that anytime having active suicidal thoughts or homicidal thoughts then he need to call 911 or go to the local emergency room.    ARFEEN,SYED T., MD 08/21/2015

## 2015-09-28 ENCOUNTER — Ambulatory Visit (INDEPENDENT_AMBULATORY_CARE_PROVIDER_SITE_OTHER): Payer: 59 | Admitting: Family Medicine

## 2015-09-28 VITALS — BP 138/82 | HR 104 | Temp 98.8°F | Resp 16 | Ht 72.0 in | Wt 306.0 lb

## 2015-09-28 DIAGNOSIS — Z0000 Encounter for general adult medical examination without abnormal findings: Secondary | ICD-10-CM | POA: Diagnosis not present

## 2015-09-28 DIAGNOSIS — Z131 Encounter for screening for diabetes mellitus: Secondary | ICD-10-CM | POA: Diagnosis not present

## 2015-09-28 DIAGNOSIS — Z1329 Encounter for screening for other suspected endocrine disorder: Secondary | ICD-10-CM

## 2015-09-28 DIAGNOSIS — R42 Dizziness and giddiness: Secondary | ICD-10-CM

## 2015-09-28 DIAGNOSIS — K5900 Constipation, unspecified: Secondary | ICD-10-CM

## 2015-09-28 DIAGNOSIS — E669 Obesity, unspecified: Secondary | ICD-10-CM | POA: Diagnosis not present

## 2015-09-28 DIAGNOSIS — K625 Hemorrhage of anus and rectum: Secondary | ICD-10-CM | POA: Diagnosis not present

## 2015-09-28 LAB — COMPLETE METABOLIC PANEL WITH GFR
ALT: 36 U/L (ref 9–46)
AST: 22 U/L (ref 10–40)
Albumin: 4.8 g/dL (ref 3.6–5.1)
Alkaline Phosphatase: 58 U/L (ref 40–115)
BUN: 23 mg/dL (ref 7–25)
CHLORIDE: 105 mmol/L (ref 98–110)
CO2: 22 mmol/L (ref 20–31)
CREATININE: 1.28 mg/dL (ref 0.60–1.35)
Calcium: 9.5 mg/dL (ref 8.6–10.3)
GFR, Est African American: 89 mL/min (ref >=60)
GFR, Est Non African American: 77 mL/min (ref >=60)
Glucose, Bld: 99 mg/dL (ref 65–99)
Potassium: 4.7 mmol/L (ref 3.5–5.3)
SODIUM: 138 mmol/L (ref 135–146)
Total Bilirubin: 0.6 mg/dL (ref 0.2–1.2)
Total Protein: 7.8 g/dL (ref 6.1–8.1)

## 2015-09-28 LAB — TSH: TSH: 2.39 mIU/L (ref 0.40–4.50)

## 2015-09-28 LAB — POCT CBC
Granulocyte percent: 74.1 %G (ref 37–80)
HCT, POC: 47.1 % (ref 43.5–53.7)
HEMOGLOBIN: 16.9 g/dL (ref 14.1–18.1)
LYMPH, POC: 1.7 (ref 0.6–3.4)
MCH, POC: 31.4 pg — AB (ref 27–31.2)
MCHC: 35.8 g/dL — AB (ref 31.8–35.4)
MCV: 87.8 fL (ref 80–97)
MID (cbc): 0.6 (ref 0–0.9)
MPV: 7.8 fL (ref 0–99.8)
POC Granulocyte: 6.6 (ref 2–6.9)
POC LYMPH PERCENT: 19.6 %L (ref 10–50)
POC MID %: 6.3 %M (ref 0–12)
Platelet Count, POC: 246 10*3/uL (ref 142–424)
RBC: 5.36 M/uL (ref 4.69–6.13)
RDW, POC: 12.1 %
WBC: 8.9 10*3/uL (ref 4.6–10.2)

## 2015-09-28 LAB — GLUCOSE, POCT (MANUAL RESULT ENTRY): POC GLUCOSE: 117 mg/dL — AB (ref 70–99)

## 2015-09-28 NOTE — Progress Notes (Addendum)
Subjective:    Patient ID: James Peters, male    DOB: 12/03/1990, 25 y.o.   MRN: [id number] By signing my name below, I, James Peters, attest that this documentation has been prepared under the direction and in the presence of James Ray, MD. Electronically Signed: Judithe Peters, ER Scribe. 09/28/2015. 2:41 PM.  Chief Complaint  Patient presents with  . Annual Exam    complete physical     HPI HPI Comments: James Peters is a 25 y.o. male who presents to [facility] reporting for an annual exam. Pt has a hx of obesity, ADHD and OCD. He is followed by Dr. Adele Peters with [facility]. Last visit with Dr. Adele Peters was one month ago. At that time his paxel was discontinued and he was started on Wellbutrin 150mg  with plan to increase to 300mg  after one week. He was continued on generic methylphenidate 36mg  in morning, and klonopin 1mg  BID. Plan for recheck in one month. Last physical was January 2016. He had borderline LDL, but normal CMP and CBC.   Today he states he is doing well, other than he is having some hard stool roughly two times per week. He does not have a PCP. He states he has blood on the toilet paper after wiping during a BM. Some of the time the blood is due to a hard stool. He does not have any pain on wiping. He denies abdominal pain. He does not believe he has hemorrhoids. He denies any unexplained weight loss, night sweats or fever. He does not exercise. He states his work schedule is prohibitively busy. He eats fast food intermittently.   He is also complaining of one event of light headedness and dizziness two weeks ago. He drank a large quantity of water and felt much better. He does not drink alcohol often. He recently stopped drinking soda.   Immunizations: Immunization History  Administered Date(s) Administered  . Influenza-Unspecified 05/15/2015  . Tdap 07/19/2014   Depression screening: Depression screen [facility] 2/9 09/28/2015 05/27/2015  Decreased Interest 0  0  Down, Depressed, Hopeless 0 0  PHQ - 2 Score 0 0  He is followed by [facility] as above.  Vision Screening:  Visual Acuity Screening   Right eye Left eye Both eyes  Without correction: 20/20 20/40 20/20   With correction:     He does not have an eye doctor.   Dental: He sees his dentist regularly.   Obesity/Diet and exercise: Body mass index is 41.49 kg/(m^2). Wt Readings from Last 3 Encounters:  09/28/15 306 lb (138.801 kg)  08/21/15 310 lb 6.4 oz (140.797 kg)  06/11/15 310 lb 9.6 oz (140.887 kg)   Patient Active Problem List   Diagnosis Date Noted  . Overweight 07/19/2014  . ADHD (attention deficit hyperactivity disorder) 07/19/2014  . OCD (obsessive compulsive disorder) 07/19/2014   Past Medical History  Diagnosis Date  . Appendicitis    Past Surgical History  Procedure Laterality Date  . Appendectomy    . Tonsillectomy     No Known Allergies Prior to Admission medications   Medication Sig Start Date End Date Taking? Authorizing Provider  buPROPion (WELLBUTRIN XL) 150 MG 24 hr tablet Take 1 tab daily in am for 1 week and than 2 tab daily 08/21/15  Yes James Nations, MD  chlorpheniramine (CHLOR-TRIMETON) 4 MG tablet Take 4 mg by mouth 2 (two) times daily as needed for allergies.   Yes Historical Provider, MD  clonazePAM (KLONOPIN) 1 MG tablet Take  1 tab daily and 1/2 at bed time 08/21/15  Yes James Nations, MD  cloNIDine (CATAPRES) 0.1 MG tablet Take 1 tablet (0.1 mg total) by mouth at bedtime. 06/11/15  Yes James Nations, MD  MELATONIN PO Take 20-25 mg by mouth.   Yes Historical Provider, MD  methylphenidate (CONCERTA) 36 MG CR tablet Take 1 tablet (36 mg total) by mouth daily. 08/21/15  Yes James Nations, MD   Social History   Social History  . Marital Status: Single    Spouse Name: N/A  . Number of Children: N/A  . Years of Education: N/A   Occupational History  . Not on file.   Social History Main Topics  . Smoking status: Never Smoker   .  Smokeless tobacco: Never Used  . Alcohol Use: No  . Drug Use: No  . Sexual Activity: Not on file   Other Topics Concern  . Not on file   Social History Narrative    Review of Systems 13 point ROS reviewed. Positive for blood in stool.      Objective:   Physical Exam  Constitutional: He is oriented to person, place, and time. He appears well-developed and well-nourished. No distress.  HENT:  Head: Normocephalic and atraumatic.  Mouth/Throat: Oropharynx is clear and moist. No oropharyngeal exudate.  Eyes: Pupils are equal, round, and reactive to light.  Neck: Neck supple.  Cardiovascular: Normal rate, regular rhythm and normal heart sounds.  Exam reveals no gallop and no friction rub.   No murmur heard. Pulmonary/Chest: Effort normal and breath sounds normal. No respiratory distress.  Musculoskeletal: Normal range of motion.  Neurological: He is alert and oriented to person, place, and time. Coordination normal.  Skin: Skin is warm and dry. He is not diaphoretic.  Psychiatric: He has a normal mood and affect. His behavior is normal.  Nursing note and vitals reviewed.  Filed Vitals:   09/28/15 1333 09/28/15 1352  BP: 142/82 138/82  Pulse: 104   Temp: 98.8 F (37.1 C)   TempSrc: Oral   Resp: 16   Height: 6' (1.829 m)   Weight: 306 lb (138.801 kg)   SpO2: 97%    Results for orders placed or performed in visit on 09/28/15  POCT CBC  Result Value Ref Range   WBC 8.9 4.6 - 10.2 K/uL   Lymph, poc 1.7 0.6 - 3.4   POC LYMPH PERCENT 19.6 10 - 50 %L   MID (cbc) 0.6 0 - 0.9   POC MID % 6.3 0 - 12 %M   POC Granulocyte 6.6 2 - 6.9   Granulocyte percent 74.1 37 - 80 %G   RBC 5.36 4.69 - 6.13 M/uL   Hemoglobin 16.9 14.1 - 18.1 g/dL   HCT, POC 47.1 43.5 - 53.7 %   MCV 87.8 80 - 97 fL   MCH, POC 31.4 (A) 27 - 31.2 pg   MCHC 35.8 (A) 31.8 - 35.4 g/dL   RDW, POC 12.1 %   Platelet Count, POC 246 142 - 424 K/uL   MPV 7.8 0 - 99.8 fL  POCT glucose (manual entry)  Result Value  Ref Range   POC Glucose 117 (A) 70 - 99 mg/dl   Last ate few hours ago.      Assessment & Plan:   James Peters is a 25 y.o. male Annual physical exam  --anticipatory guidance as below in AVS, screening labs above. Health maintenance items as above in HPI discussed/recommended as applicable.  Lightheadedness - Plan: POCT CBC, COMPLETE METABOLIC PANEL WITH GFR, TSH  -reassuring vitals, cbc. Single episode, now resolved. Maintain hydration, rtc precautions if recurrent.   Obesity - Plan: TSH  - diet, exercise discussed. tsh pending.   BRBPR (bright red blood per rectum) - Plan: Ambulatory referral to Gastroenterology   - may be related to constipation/hemorrhoids. Adopt higher fiber diet, and will refer to GI for eval. rtc if worsening.   Constipation, unspecified constipation type - Plan: TSH  -as above. rtc precautions, GI referral.   Screening for diabetes mellitus - Plan: POCT glucose (manual entry), COMPLETE METABOLIC PANEL WITH GFR  Thyroid disorder screening - Plan: TSH  Recheck in 3-6 months to eval weight, and fasting if possible for lipid panel .    Meds ordered this encounter  Medications  . MELATONIN PO    Sig: Take 20-25 mg by mouth.  . chlorpheniramine (CHLOR-TRIMETON) 4 MG tablet    Sig: Take 4 mg by mouth 2 (two) times daily as needed for allergies.   Patient Instructions       IF you received an x-Peters today, you will receive an invoice from [facility]. Please contact [facility] at [phone] with questions or concerns regarding your invoice.   IF you received labwork today, you will receive an invoice from [employer]. Please contact [facility] at [phone] with questions or concerns regarding your invoice.   Our billing staff will not be able to assist you with questions regarding bills from these companies.  You will be contacted with the lab results as soon as they are available. The fastest way to  get your results is to activate your My Chart account. Instructions are located on the last page of this paperwork. If you have not heard from [place] regarding the results in 2 weeks, please contact this office.    See info below on constipation.  Fiber supplement such as metamucil or Citrucel, and Miralax if needed for constipation. I will refer you to a gastroenterologist to evaluate the bleeding.   Avoid fast food, sugar containing beverages, portion control as discussed, and increase activity/exercise. Follow-up in the next 3-6 months to evaluate weight and possibly cholesterol testing at that time. Let me know if you have any questions in the meantime.  Your blood pressure was borderline here in the office today, Keep a record of your blood pressures outside of the office and if remaining over 140/90, recommend recheck to discuss possible medication.  Return to the clinic or go to the nearest emergency room if any of your symptoms worsen or new symptoms occur.   Constipation, Adult Constipation is when a person has fewer than three bowel movements a week, has difficulty having a bowel movement, or has stools that are dry, hard, or larger than normal. As people grow older, constipation is more common. A low-fiber diet, not taking in enough fluids, and taking certain medicines may make constipation worse.  CAUSES  1. Certain medicines, such as antidepressants, pain medicine, iron supplements, antacids, and water pills.  2. Certain diseases, such as diabetes, irritable bowel syndrome (IBS), thyroid disease, or depression.  3. Not drinking enough water.  4. Not eating enough fiber-rich foods.  5. Stress or travel.  6. Lack of physical activity or exercise.  7. Ignoring the urge to have a bowel movement.  8. Using laxatives too much.  SIGNS AND SYMPTOMS  1. Having fewer than three bowel movements a week.  2. Straining to have a  bowel movement.  3. Having stools that are hard, dry, or  larger than normal.  4. Feeling full or bloated.  5. Pain in the lower abdomen.  6. Not feeling relief after having a bowel movement.  DIAGNOSIS  Your health care provider will take a medical history and perform a physical exam. Further testing may be done for severe constipation. Some tests may include: 1. A barium enema X-Peters to examine your rectum, colon, and, sometimes, your small intestine.  2. A sigmoidoscopy to examine your lower colon.  3. A colonoscopy to examine your entire colon. TREATMENT  Treatment will depend on the severity of your constipation and what is causing it. Some dietary treatments include drinking more fluids and eating more fiber-rich foods. Lifestyle treatments may include regular exercise. If these diet and lifestyle recommendations do not help, your health care provider may recommend taking over-the-counter laxative medicines to help you have bowel movements. Prescription medicines may be prescribed if over-the-counter medicines do not work.  HOME CARE INSTRUCTIONS   Eat foods that have a lot of fiber, such as fruits, vegetables, whole grains, and beans.  Limit foods high in fat and processed sugars, such as french fries, hamburgers, cookies, candies, and soda.   A fiber supplement may be added to your diet if you cannot get enough fiber from foods.   Drink enough fluids to keep your urine clear or pale yellow.   Exercise regularly or as directed by your health care provider.   Go to the restroom when you have the urge to go. Do not hold it.   Only take over-the-counter or prescription medicines as directed by your health care provider. Do not take other medicines for constipation without talking to your health care provider first.  [place] IF:   You have bright red blood in your stool.   Your constipation lasts for more than 4 days or gets worse.   You have abdominal or rectal pain.   You have thin, pencil-like  stools.   You have unexplained weight loss. MAKE SURE YOU:   Understand these instructions.  Will watch your condition.  Will get help right away if you are not doing well or get worse.   This information is not intended to replace advice given to you by your health care provider. Make sure you discuss any questions you have with your health care provider.   Document Released: 03/20/2004 Document Revised: 07/13/2014 Document Reviewed: 04/03/2013 Elsevier Interactive Patient Education 2016 [place] you healthy  Get these tests  Blood pressure- Have your blood pressure checked once a year by your healthcare provider.  Normal blood pressure is 120/80.  Weight- Have your body mass index (BMI) calculated to screen for obesity.  BMI is a measure of body fat based on height and weight. You can also calculate your own BMI at [employer]velBags.it.  Cholesterol- Have your cholesterol checked regularly starting at age 64, sooner may be necessary if you have diabetes, high blood pressure, if a family member developed heart diseases at an early age or if you smoke.   Chlamydia, HIV, and other sexual transmitted disease- Get screened each year until the age of 72 then within three months of each new sexual partner.  Diabetes- Have your blood sugar checked regularly if you have high blood pressure, high cholesterol, a family history of diabetes or if you are overweight.  Get these vaccines  Flu shot- Every fall.  Tetanus shot- Every 10 years.  Menactra- Single dose; prevents meningitis.  Take these steps  Don't smoke- If you do smoke, ask your healthcare provider about quitting. For tips on how to quit, go to www.smokefree.gov or call 1-800-QUIT-NOW.  Be physically active- Exercise 5 days a week for at least 30 minutes.  If you are not already physically active start slow and gradually work up to 30 minutes of moderate physical activity.  Examples of moderate activity  include walking briskly, mowing the yard, dancing, swimming bicycling, etc.  Eat a healthy diet- Eat a variety of healthy foods such as fruits, vegetables, low fat milk, low fat cheese, yogurt, lean meats, poultry, fish, beans, tofu, etc.  For more information on healthy eating, go to www.thenutritionsource.org  Drink alcohol in moderation- Limit alcohol intake two drinks or less a day.  Never drink and drive.  Dentist- Brush and floss teeth twice daily; visit your dentis twice a year.  Depression-Your emotional health is as important as your physical health.  If you're feeling down, losing interest in things you normally enjoy please talk with your healthcare provider.  Gun Safety- If you keep a gun in your home, keep it unloaded and with the safety lock on.  Bullets should be stored separately.  Helmet use- Always wear a helmet when riding a motorcycle, bicycle, rollerblading or skateboarding.  Safe sex- If you may be exposed to a sexually transmitted infection, use a condom  Seat belts- Seat bels can save your life; always wear one.  Smoke/Carbon Monoxide detectors- These detectors need to be installed on the appropriate level of your home.  Replace batteries at least once a year.  Skin Cancer- When out in the sun, cover up and use sunscreen SPF 15 or higher. Violence- If anyone is threatening or hurting you, please tell your healthcare provider.      I personally performed the services described in this documentation, which was scribed in my presence. The recorded information has been reviewed and considered, and addended by me as needed.

## 2015-09-28 NOTE — Patient Instructions (Addendum)
IF you received an x-ray today, you will receive an invoice from [facility]. Please contact [facility] at [phone] with questions or concerns regarding your invoice.   IF you received labwork today, you will receive an invoice from [employer]. Please contact [facility] at [phone] with questions or concerns regarding your invoice.   Our billing staff will not be able to assist you with questions regarding bills from these companies.  You will be contacted with the lab results as soon as they are available. The fastest way to get your results is to activate your My Chart account. Instructions are located on the last page of this paperwork. If you have not heard from [place] regarding the results in 2 weeks, please contact this office.    See info below on constipation.  Fiber supplement such as metamucil or Citrucel, and Miralax if needed for constipation. I will refer you to a gastroenterologist to evaluate the bleeding.   Avoid fast food, sugar containing beverages, portion control as discussed, and increase activity/exercise. Follow-up in the next 3-6 months to evaluate weight and possibly cholesterol testing at that time. Let me know if you have any questions in the meantime.  Your blood pressure was borderline here in the office today, Keep a record of your blood pressures outside of the office and if remaining over 140/90, recommend recheck to discuss possible medication.  Return to the clinic or go to the nearest emergency room if any of your symptoms worsen or new symptoms occur.   Constipation, Adult Constipation is when a person has fewer than three bowel movements a week, has difficulty having a bowel movement, or has stools that are dry, hard, or larger than normal. As people grow older, constipation is more common. A low-fiber diet, not taking in enough fluids, and taking certain medicines may make constipation worse.  CAUSES   1. Certain medicines, such as antidepressants, pain medicine, iron supplements, antacids, and water pills.  2. Certain diseases, such as diabetes, irritable bowel syndrome (IBS), thyroid disease, or depression.  3. Not drinking enough water.  4. Not eating enough fiber-rich foods.  5. Stress or travel.  6. Lack of physical activity or exercise.  7. Ignoring the urge to have a bowel movement.  8. Using laxatives too much.  SIGNS AND SYMPTOMS  1. Having fewer than three bowel movements a week.  2. Straining to have a bowel movement.  3. Having stools that are hard, dry, or larger than normal.  4. Feeling full or bloated.  5. Pain in the lower abdomen.  6. Not feeling relief after having a bowel movement.  DIAGNOSIS  Your health care provider will take a medical history and perform a physical exam. Further testing may be done for severe constipation. Some tests may include: 1. A barium enema X-ray to examine your rectum, colon, and, sometimes, your small intestine.  2. A sigmoidoscopy to examine your lower colon.  3. A colonoscopy to examine your entire colon. TREATMENT  Treatment will depend on the severity of your constipation and what is causing it. Some dietary treatments include drinking more fluids and eating more fiber-rich foods. Lifestyle treatments may include regular exercise. If these diet and lifestyle recommendations do not help, your health care provider may recommend taking over-the-counter laxative medicines to help you have bowel movements. Prescription medicines may be prescribed if over-the-counter medicines do not work.  HOME CARE INSTRUCTIONS   Eat foods that have a lot of fiber,  such as fruits, vegetables, whole grains, and beans.  Limit foods high in fat and processed sugars, such as french fries, hamburgers, cookies, candies, and soda.   A fiber supplement may be added to your diet if you cannot get enough fiber from foods.   Drink enough fluids  to keep your urine clear or pale yellow.   Exercise regularly or as directed by your health care provider.   Go to the restroom when you have the urge to go. Do not hold it.   Only take over-the-counter or prescription medicines as directed by your health care provider. Do not take other medicines for constipation without talking to your health care provider first.  [place] IF:   You have bright red blood in your stool.   Your constipation lasts for more than 4 days or gets worse.   You have abdominal or rectal pain.   You have thin, pencil-like stools.   You have unexplained weight loss. MAKE SURE YOU:   Understand these instructions.  Will watch your condition.  Will get help right away if you are not doing well or get worse.   This information is not intended to replace advice given to you by your health care provider. Make sure you discuss any questions you have with your health care provider.   Document Released: 03/20/2004 Document Revised: 07/13/2014 Document Reviewed: 04/03/2013 Elsevier Interactive Patient Education 2016 [place] you healthy  Get these tests  Blood pressure- Have your blood pressure checked once a year by your healthcare provider.  Normal blood pressure is 120/80.  Weight- Have your body mass index (BMI) calculated to screen for obesity.  BMI is a measure of body fat based on height and weight. You can also calculate your own BMI at [employer]ags[employer]it.  Cholesterol- Have your cholesterol checked regularly starting at age 19, sooner may be necessary if you have diabetes, high blood pressure, if a family member developed heart diseases at an early age or if you smoke.   Chlamydia, HIV, and other sexual transmitted disease- Get screened each year until the age of 92 then within three months of each new sexual partner.  Diabetes- Have your blood sugar checked regularly if you have high blood pressure,  high cholesterol, a family history of diabetes or if you are overweight.  Get these vaccines  Flu shot- Every fall.  Tetanus shot- Every 10 years.  Menactra- Single dose; prevents meningitis.  Take these steps  Don't smoke- If you do smoke, ask your healthcare provider about quitting. For tips on how to quit, go to www.smokefree.gov or call 1-800-QUIT-NOW.  Be physically active- Exercise 5 days a week for at least 30 minutes.  If you are not already physically active start slow and gradually work up to 30 minutes of moderate physical activity.  Examples of moderate activity include walking briskly, mowing the yard, dancing, swimming bicycling, etc.  Eat a healthy diet- Eat a variety of healthy foods such as fruits, vegetables, low fat milk, low fat cheese, yogurt, lean meats, poultry, fish, beans, tofu, etc.  For more information on healthy eating, go to www.thenutritionsource.org  Drink alcohol in moderation- Limit alcohol intake two drinks or less a day.  Never drink and drive.  Dentist- Brush and floss teeth twice daily; visit your dentis twice a year.  Depression-Your emotional health is as important as your physical health.  If you're feeling down, losing interest in things you normally enjoy please talk with your  healthcare provider.  Gun Safety- If you keep a gun in your home, keep it unloaded and with the safety lock on.  Bullets should be stored separately.  Helmet use- Always wear a helmet when riding a motorcycle, bicycle, rollerblading or skateboarding.  Safe sex- If you may be exposed to a sexually transmitted infection, use a condom  Seat belts- Seat bels can save your life; always wear one.  Smoke/Carbon Monoxide detectors- These detectors need to be installed on the appropriate level of your home.  Replace batteries at least once a year.  Skin Cancer- When out in the sun, cover up and use sunscreen SPF 15 or higher. Violence- If anyone is threatening or hurting you,  please tell your healthcare provider.

## 2015-10-01 ENCOUNTER — Encounter (HOSPITAL_COMMUNITY): Payer: Self-pay | Admitting: Psychiatry

## 2015-10-01 ENCOUNTER — Ambulatory Visit (INDEPENDENT_AMBULATORY_CARE_PROVIDER_SITE_OTHER): Payer: 59 | Admitting: Psychiatry

## 2015-10-01 VITALS — BP 151/88 | HR 102 | Ht 72.5 in | Wt 307.0 lb

## 2015-10-01 DIAGNOSIS — F411 Generalized anxiety disorder: Secondary | ICD-10-CM | POA: Diagnosis not present

## 2015-10-01 DIAGNOSIS — F900 Attention-deficit hyperactivity disorder, predominantly inattentive type: Secondary | ICD-10-CM

## 2015-10-01 MED ORDER — CONCERTA 36 MG PO TBCR: 36.0000 mg | EXTENDED_RELEASE_TABLET | Freq: Every day | ORAL | Status: DC

## 2015-10-01 MED ORDER — CLONAZEPAM 1 MG PO TABS: ORAL_TABLET | ORAL | Status: DC

## 2015-10-01 MED ORDER — CLONIDINE HCL 0.1 MG PO TABS: 0.1000 mg | ORAL_TABLET | Freq: Every day | ORAL | Status: DC

## 2015-10-01 MED ORDER — BUPROPION HCL ER (XL) 300 MG PO TB24: 300.0000 mg | ORAL_TABLET | Freq: Every day | ORAL | Status: DC

## 2015-10-01 NOTE — Progress Notes (Signed)
[employer] [phone] Progress Note  Judd Mccubbin [id number] 25 y.o.  10/01/2015 9:28 AM  Chief Complaint:  I like new medication.  It is helping my focus, attention.  I have no side effects.  My depression is getting better.          History of Present Illness:  Jaxn came for his follow-up appointment.  On his last visit we discontinued Paxil because he was not feeling better and recommended to try Wellbutrin.  He is taking Wellbutrin XL 150 mg 2 tablet daily.  He has seen a significant improvement in his depression, attention, focus and energy level.  He sleeping better.  He denies any irritability or any depressive thoughts.  Recently he seen his physician and had blood work.  He lost 3 pounds from the last visit.  He is taking Concerta but like to go back on brand name because he does not see a huge difference financially and preferred to have brand-name insert of generic.  He is taking clonidine and Klonopin.  He denies any major panic attack.  His attention and focus is better.  He has no tremors, shakes, EPS.  He likes his job.  He is working for [employer].  He lives by himself.  He is single and he has no children.  He denies any feeling of hopelessness, worthlessness or any suicidal thoughts.  His energy level is good.  His appetite is okay.  His vitals are stable.  Suicidal Ideation: No Plan Formed: No Patient has means to carry out plan: No  Homicidal Ideation: No Plan Formed: No Patient has means to carry out plan: No  Past Psychiatric History/Hospitalization(s): Patient diagnosed with ADHD when he was in school.  He has formal psychological testing.  He is taking stimulant since his his school age.  In the past few years he developed anxiety symptoms and he was given Klonopin and Paxil.  He was seeing Debbora Dus .  Patient denies any history of psychiatric inpatient treatment, paranoia, hallucination, suicidal attempt, mania or any psychosis. Anxiety:  Yes Bipolar Disorder: No Depression: Yes Mania: No Psychosis: No Schizophrenia: No Personality Disorder: No Hospitalization for psychiatric illness: No History of Electroconvulsive Shock Therapy: No Prior Suicide Attempts: No  Medical History; Patient has no active medical problem.  He denies any history of seizures, headaches.  His primary care physician is Dr. Silvestre Mesi.  He had blood work in January 2016.  He has borderline LDL.  Family History; Patient endorse her grandmother has diagnosed with schizophrenia.  Substance Abuse History; Patient admitted history of social drinking which is one beer and one to 2 months.  He denies drinking heavily, intoxication, blackouts or any seizures.  He denies any illegal substance use.    Review of Systems  Cardiovascular: Negative for chest pain and palpitations.  Musculoskeletal: Negative.   Skin: Negative for itching and rash.  Neurological: Negative for dizziness, tingling, tremors and headaches.  Psychiatric/Behavioral: Negative for suicidal ideas and substance abuse.    Psychiatric: Agitation: No Hallucination: No Depressed Mood: No Insomnia: No Hypersomnia: No Altered Concentration: No Feels Worthless: No Grandiose Ideas: No Belief In Special Powers: No New/Increased Substance Abuse: No Compulsions: No  Neurologic: Headache: No Seizure: No Paresthesias: No   Outpatient Encounter Prescriptions as of 10/01/2015  Medication Sig  . buPROPion (WELLBUTRIN XL) 300 MG 24 hr tablet Take 1 tablet (300 mg total) by mouth daily.  . chlorpheniramine (CHLOR-TRIMETON) 4 MG tablet Take 4 mg by mouth 2 (  two) times daily as needed for allergies.  . clonazePAM (KLONOPIN) 1 MG tablet Take 1 tab daily and 1/2 at bed time  . cloNIDine (CATAPRES) 0.1 MG tablet Take 1 tablet (0.1 mg total) by mouth at bedtime.  . CONCERTA 36 MG CR tablet Take 1 tablet (36 mg total) by mouth daily.  Marland Kitchen MELATONIN PO Take 20-25 mg by mouth.  .  [DISCONTINUED] buPROPion (WELLBUTRIN XL) 150 MG 24 hr tablet Take 1 tab daily in am for 1 week and than 2 tab daily  . [DISCONTINUED] clonazePAM (KLONOPIN) 1 MG tablet Take 1 tab daily and 1/2 at bed time  . [DISCONTINUED] cloNIDine (CATAPRES) 0.1 MG tablet Take 1 tablet (0.1 mg total) by mouth at bedtime.  . [DISCONTINUED] CONCERTA 36 MG CR tablet Take 1 tablet (36 mg total) by mouth daily.  . [DISCONTINUED] methylphenidate (CONCERTA) 36 MG CR tablet Take 1 tablet (36 mg total) by mouth daily.   No facility-administered encounter medications on file as of 10/01/2015.    Recent Results (from the past 2160 hour(s))  COMPLETE METABOLIC PANEL WITH GFR     Status: None   Collection Time: 09/28/15  2:56 PM  Result Value Ref Range   Sodium 138 135 - 146 mmol/L   Potassium 4.7 3.5 - 5.3 mmol/L   Chloride 105 98 - 110 mmol/L   CO2 22 20 - 31 mmol/L   Glucose, Bld 99 65 - 99 mg/dL   BUN 23 7 - 25 mg/dL   Creat 1.28 0.60 - 1.35 mg/dL   Total Bilirubin 0.6 0.2 - 1.2 mg/dL   Alkaline Phosphatase 58 40 - 115 U/L   AST 22 10 - 40 U/L   ALT 36 9 - 46 U/L   Total Protein 7.8 6.1 - 8.1 g/dL   Albumin 4.8 3.6 - 5.1 g/dL   Calcium 9.5 8.6 - 10.3 mg/dL   GFR, Est African American 89 >=60 mL/min   GFR, Est Non African American 77 >=60 mL/min    Comment:   The estimated GFR is a calculation valid for adults (>=7 years old) that uses the CKD-EPI algorithm to adjust for age and sex. It is   not to be used for children, pregnant women, hospitalized patients,    patients on dialysis, or with rapidly changing kidney function. According to the NKDEP, eGFR >89 is normal, 60-89 shows mild impairment, 30-59 shows moderate impairment, 15-29 shows severe impairment and <15 is ESRD.     TSH     Status: None   Collection Time: 09/28/15  2:56 PM  Result Value Ref Range   TSH 2.39 0.40 - 4.50 mIU/L  POCT CBC     Status: Abnormal   Collection Time: 09/28/15  3:03 PM  Result Value Ref Range   WBC 8.9 4.6 -  10.2 K/uL   Lymph, poc 1.7 0.6 - 3.4   POC LYMPH PERCENT 19.6 10 - 50 %L   MID (cbc) 0.6 0 - 0.9   POC MID % 6.3 0 - 12 %M   POC Granulocyte 6.6 2 - 6.9   Granulocyte percent 74.1 37 - 80 %G   RBC 5.36 4.69 - 6.13 M/uL   Hemoglobin 16.9 14.1 - 18.1 g/dL   HCT, POC 47.1 43.5 - 53.7 %   MCV 87.8 80 - 97 fL   MCH, POC 31.4 (A) 27 - 31.2 pg   MCHC 35.8 (A) 31.8 - 35.4 g/dL   RDW, POC 12.1 %   Platelet Count, POC  246 142 - 424 K/uL   MPV 7.8 0 - 99.8 fL  POCT glucose (manual entry)     Status: Abnormal   Collection Time: 09/28/15  3:03 PM  Result Value Ref Range   POC Glucose 117 (A) 70 - 99 mg/dl      Constitutional:  BP 151/88 mmHg  Pulse 102  Ht 6' 0.5" (1.842 m)  Wt 307 lb (139.254 kg)  BMI 41.04 kg/m2   Musculoskeletal: Strength & Muscle Tone: within normal limits Gait & Station: normal Patient leans: N/A  Psychiatric Specialty Exam: General Appearance: Casual  Eye Contact::  Fair  Speech:  Normal Rate  Volume:  Normal  Mood:  Anxious  Affect:  Appropriate  Thought Process:  Coherent and Goal Directed  Orientation:  Full (Time, Place, and Person)  Thought Content:  WDL  Suicidal Thoughts:  No  Homicidal Thoughts:  No  Memory:  Immediate;   Good Recent;   Good Remote;   Good  Judgement:  Good  Insight:  Good  Psychomotor Activity:  Normal  Concentration:  Good  Recall:  Good  Fund of Knowledge:  Good  Language:  Good  Akathisia:  No  Handed:  Right  AIMS (if indicated):     Assets:  Communication Skills Desire for Improvement Financial Resources/Insurance Housing Physical Health Resilience Social Support Talents/Skills Transportation  ADL's:  Intact  Cognition:  WNL  Sleep:        Established Problem, Stable/Improving (1), Review of Psycho-Social Stressors (1), Review or order clinical lab tests (1), Review and summation of old records (2), Review of Last Therapy Session (1), Review of Medication Regimen & Side Effects (2) and Review of New  Medication or Change in Dosage (2)  Assessment: Axis I: ADHD, inattentive type.  Anxiety disorder NOS  Axis II: Deferred  Axis III:  Past Medical History  Diagnosis Date  . Appendicitis      Plan:  I review his blood work results, psychosocial stressors and his current medication.  He is doing better on Wellbutrin XL 300 mg daily.  He is no longer taking Paxil.  I will continue Wellbutrin XL 300 mg daily, Klonopin 0.5 mg in the morning and 1 mg at bedtime , clonidine 0.1 mg at bedtime and Concerta brand name 36 mg daily.  Discussed benzodiazepine and stimulant abuse and withdrawal symptoms.  He does not ask for early refills for controlled substance.  Encourage the due to her exercise and to join [facility], [facility], [facility] .  Discussed medication side effects and benefits.  Recommended to call us back if he has any question or any concern.  Follow-up in 2 months.  Discuss safety plan that anytime having active suicidal thoughts or homicidal thoughts then he need to call 911 or go to the local emergency room.    ARFEEN,SYED T., MD 10/01/2015

## 2015-10-15 ENCOUNTER — Encounter: Payer: Self-pay | Admitting: *Deleted

## 2015-11-26 ENCOUNTER — Encounter (HOSPITAL_COMMUNITY): Payer: Self-pay | Admitting: Psychiatry

## 2015-11-26 ENCOUNTER — Ambulatory Visit (INDEPENDENT_AMBULATORY_CARE_PROVIDER_SITE_OTHER): Payer: 59 | Admitting: Psychiatry

## 2015-11-26 VITALS — BP 126/82 | HR 96 | Ht 73.0 in | Wt 283.4 lb

## 2015-11-26 DIAGNOSIS — F411 Generalized anxiety disorder: Secondary | ICD-10-CM

## 2015-11-26 DIAGNOSIS — F900 Attention-deficit hyperactivity disorder, predominantly inattentive type: Secondary | ICD-10-CM

## 2015-11-26 MED ORDER — CLONAZEPAM 1 MG PO TABS: ORAL_TABLET | ORAL | Status: DC

## 2015-11-26 MED ORDER — CLONIDINE HCL 0.1 MG PO TABS: 0.1000 mg | ORAL_TABLET | Freq: Every day | ORAL | Status: DC

## 2015-11-26 MED ORDER — CONCERTA 36 MG PO TBCR: 36.0000 mg | EXTENDED_RELEASE_TABLET | Freq: Every day | ORAL | Status: DC

## 2015-11-26 MED ORDER — BUPROPION HCL ER (XL) 300 MG PO TB24: 300.0000 mg | ORAL_TABLET | Freq: Every day | ORAL | Status: DC

## 2015-11-26 NOTE — Progress Notes (Signed)
[place] Progress Note  James Peters [id number] 25 y.o.  11/26/2015 8:55 AM  Chief Complaint:  I am doing better .  I lost weight.  My focus and attention is good.  I'm looking for a new job.           History of Present Illness:  James Peters came for his follow-up appointment.  He is taking his medication as prescribed.  He denies any irritability, anger, mood swing.  His attention and focus and multitasking is improved.  His ADD symptoms are much control.  He also more active and lost 30 pounds in past 2 months.  He is going to [facility] and watching his diet pretty regularly.  He is also looking for a better job since his performance is improved.  He denies any tremors, shakes, headaches or any insomnia.  He rarely takes melatonin.  Denies any major panic attack.  His energy level is good.  He reported his coworker also compliment on his progress. He is working for [employer].  He lives by himself.  He is single and he has no children.  His appetite is okay.  His vitals are stable and he lost 30 pounds from the last visit.  He denies drinking alcohol or using any illegal substances.  Suicidal Ideation: No Plan Formed: No Patient has means to carry out plan: No  Homicidal Ideation: No Plan Formed: No Patient has means to carry out plan: No  Past Psychiatric History/Hospitalization(s): Patient diagnosed with ADHD when he was in school.  He has formal psychological testing.  He is taking stimulant since his his school age.  In the past few years he developed anxiety symptoms and he was given Klonopin and Paxil.  He was seeing Debbora Dus .  Patient denies any history of psychiatric inpatient treatment, paranoia, hallucination, suicidal attempt, mania or any psychosis. Anxiety: Yes Bipolar Disorder: No Depression: Yes Mania: No Psychosis: No Schizophrenia: No Personality Disorder: No Hospitalization for psychiatric illness: No History of Electroconvulsive Shock  Therapy: No Prior Suicide Attempts: No  Medical History; Patient has no active medical problem.  He denies any history of seizures, headaches.  His primary care physician is Dr. Silvestre Mesi.  He had blood work in January 2016.  He has borderline LDL.  Family History; Patient endorse her grandmother has diagnosed with schizophrenia.  Substance Abuse History; Patient admitted history of social drinking which is one beer and one to 2 months.  He denies drinking heavily, intoxication, blackouts or any seizures.  He denies any illegal substance use.    Review of Systems  Cardiovascular: Negative for chest pain and palpitations.  Musculoskeletal: Negative.   Skin: Negative for itching and rash.  Neurological: Negative for dizziness, tingling, tremors and headaches.  Psychiatric/Behavioral: Negative for suicidal ideas and substance abuse.    Psychiatric: Agitation: No Hallucination: No Depressed Mood: No Insomnia: No Hypersomnia: No Altered Concentration: No Feels Worthless: No Grandiose Ideas: No Belief In Special Powers: No New/Increased Substance Abuse: No Compulsions: No  Neurologic: Headache: No Seizure: No Paresthesias: No   Outpatient Encounter Prescriptions as of 11/26/2015  Medication Sig  . buPROPion (WELLBUTRIN XL) 300 MG 24 hr tablet Take 1 tablet (300 mg total) by mouth daily.  . chlorpheniramine (CHLOR-TRIMETON) 4 MG tablet Take 4 mg by mouth 2 (two) times daily as needed for allergies.  . clonazePAM (KLONOPIN) 1 MG tablet Take 1 tab daily and 1/2 at bed time  . cloNIDine (CATAPRES) 0.1 MG tablet Take  1 tablet (0.1 mg total) by mouth at bedtime.  . CONCERTA 36 MG CR tablet Take 1 tablet (36 mg total) by mouth daily.  Marland Kitchen MELATONIN PO Take 20-25 mg by mouth.  . [DISCONTINUED] buPROPion (WELLBUTRIN XL) 300 MG 24 hr tablet Take 1 tablet (300 mg total) by mouth daily.  . [DISCONTINUED] clonazePAM (KLONOPIN) 1 MG tablet Take 1 tab daily and 1/2 at bed time  .  [DISCONTINUED] cloNIDine (CATAPRES) 0.1 MG tablet Take 1 tablet (0.1 mg total) by mouth at bedtime.  . [DISCONTINUED] CONCERTA 36 MG CR tablet Take 1 tablet (36 mg total) by mouth daily.  . [DISCONTINUED] CONCERTA 36 MG CR tablet Take 1 tablet (36 mg total) by mouth daily.   No facility-administered encounter medications on file as of 11/26/2015.    Recent Results (from the past 2160 hour(s))  COMPLETE METABOLIC PANEL WITH GFR     Status: None   Collection Time: 09/28/15  2:56 PM  Result Value Ref Range   Sodium 138 135 - 146 mmol/L   Potassium 4.7 3.5 - 5.3 mmol/L   Chloride 105 98 - 110 mmol/L   CO2 22 20 - 31 mmol/L   Glucose, Bld 99 65 - 99 mg/dL   BUN 23 7 - 25 mg/dL   Creat 1.28 0.60 - 1.35 mg/dL   Total Bilirubin 0.6 0.2 - 1.2 mg/dL   Alkaline Phosphatase 58 40 - 115 U/L   AST 22 10 - 40 U/L   ALT 36 9 - 46 U/L   Total Protein 7.8 6.1 - 8.1 g/dL   Albumin 4.8 3.6 - 5.1 g/dL   Calcium 9.5 8.6 - 10.3 mg/dL   GFR, Est African American 89 >=60 mL/min   GFR, Est Non African American 77 >=60 mL/min    Comment:   The estimated GFR is a calculation valid for adults (>=88 years old) that uses the CKD-EPI algorithm to adjust for age and sex. It is   not to be used for children, pregnant women, hospitalized patients,    patients on dialysis, or with rapidly changing kidney function. According to the NKDEP, eGFR >89 is normal, 60-89 shows mild impairment, 30-59 shows moderate impairment, 15-29 shows severe impairment and <15 is ESRD.     TSH     Status: None   Collection Time: 09/28/15  2:56 PM  Result Value Ref Range   TSH 2.39 0.40 - 4.50 mIU/L  POCT CBC     Status: Abnormal   Collection Time: 09/28/15  3:03 PM  Result Value Ref Range   WBC 8.9 4.6 - 10.2 K/uL   Lymph, poc 1.7 0.6 - 3.4   POC LYMPH PERCENT 19.6 10 - 50 %L   MID (cbc) 0.6 0 - 0.9   POC MID % 6.3 0 - 12 %M   POC Granulocyte 6.6 2 - 6.9   Granulocyte percent 74.1 37 - 80 %G   RBC 5.36 4.69 - 6.13 M/uL    Hemoglobin 16.9 14.1 - 18.1 g/dL   HCT, POC 47.1 43.5 - 53.7 %   MCV 87.8 80 - 97 fL   MCH, POC 31.4 (A) 27 - 31.2 pg   MCHC 35.8 (A) 31.8 - 35.4 g/dL   RDW, POC 12.1 %   Platelet Count, POC 246 142 - 424 K/uL   MPV 7.8 0 - 99.8 fL  POCT glucose (manual entry)     Status: Abnormal   Collection Time: 09/28/15  3:03 PM  Result Value Ref Range  POC Glucose 117 (A) 70 - 99 mg/dl      Constitutional:  BP 126/82 mmHg  Pulse 96  Ht 6' 1"  (1.854 m)  Wt 283 lb 6.4 oz (128.549 kg)  BMI 37.40 kg/m2   Musculoskeletal: Strength & Muscle Tone: within normal limits Gait & Station: normal Patient leans: N/A  Psychiatric Specialty Exam: General Appearance: Casual  Eye Contact::  Fair  Speech:  Normal Rate  Volume:  Normal  Mood:  Euthymic  Affect:  Appropriate  Thought Process:  Coherent and Goal Directed  Orientation:  Full (Time, Place, and Person)  Thought Content:  WDL  Suicidal Thoughts:  No  Homicidal Thoughts:  No  Memory:  Immediate;   Good Recent;   Good Remote;   Good  Judgement:  Good  Insight:  Good  Psychomotor Activity:  Normal  Concentration:  Good  Recall:  Good  Fund of Knowledge:  Good  Language:  Good  Akathisia:  No  Handed:  Right  AIMS (if indicated):     Assets:  Communication Skills Desire for Improvement Financial Resources/Insurance Housing Physical Health Resilience Social Support Talents/Skills Transportation  ADL's:  Intact  Cognition:  WNL  Sleep:        Established Problem, Stable/Improving (1), Review of Last Therapy Session (1) and Review of Medication Regimen & Side Effects (2)  Assessment: Axis I: ADHD, inattentive type.  Anxiety disorder NOS  Axis II: Deferred  Axis III:  Past Medical History  Diagnosis Date  . Appendicitis      Plan:  Patient is doing better on his current psychiatric medication.  He has no side effects.  I will continue Wellbutrin XL 300 mg daily, Klonopin 0.5 mg in the morning and 1 mg at  bedtime , clonidine 0.1 mg at bedtime and Concerta brand name 36 mg daily.  Discussed benzodiazepine and stimulant abuse and withdrawal symptoms.  He does not ask for early refills for controlled substance.  Encourage to continue exercise and watching his calorie intake.  Discussed medication side effects and benefits.  Recommended to call us back if he has any question or any concern.  Follow-up in 3 months.  Discuss safety plan that anytime having active suicidal thoughts or homicidal thoughts then he need to call 911 or go to the local emergency room.    Christipher Rieger T., MD 11/26/2015

## 2016-02-26 ENCOUNTER — Other Ambulatory Visit (HOSPITAL_COMMUNITY): Payer: Self-pay | Admitting: Psychiatry

## 2016-02-26 ENCOUNTER — Ambulatory Visit (INDEPENDENT_AMBULATORY_CARE_PROVIDER_SITE_OTHER): Payer: 59 | Admitting: Psychiatry

## 2016-02-26 DIAGNOSIS — F411 Generalized anxiety disorder: Secondary | ICD-10-CM | POA: Diagnosis not present

## 2016-02-26 DIAGNOSIS — F900 Attention-deficit hyperactivity disorder, predominantly inattentive type: Secondary | ICD-10-CM | POA: Diagnosis not present

## 2016-02-26 MED ORDER — CONCERTA 36 MG PO TBCR: 36.0000 mg | EXTENDED_RELEASE_TABLET | Freq: Every day | ORAL | 0 refills | Status: DC

## 2016-02-26 MED ORDER — BUPROPION HCL ER (XL) 300 MG PO TB24: 300.0000 mg | ORAL_TABLET | Freq: Every day | ORAL | 2 refills | Status: DC

## 2016-02-26 MED ORDER — CLONAZEPAM 1 MG PO TABS: ORAL_TABLET | ORAL | 2 refills | Status: DC

## 2016-02-26 NOTE — Progress Notes (Signed)
[facility] MD/PA/NP OP Progress Note  02/26/2016 9:16 AM James Peters  MRN:  [id number]  Chief Complaint:  Subjective:  Doing okay he says needs refills HPI: no new complaints.  Some trouble sleeping but not enough to take a medication he believes.  Thinking about a new job, just bought a new car and is dealing with the stress okay he believes.  Focus is good.  Depression is under control Visit Diagnosis:    ICD-9-CM ICD-10-CM   1. Generalized anxiety disorder 300.02 F41.1 buPROPion (WELLBUTRIN XL) 300 MG 24 hr tablet     clonazePAM (KLONOPIN) 1 MG tablet  2. Attention deficit hyperactivity disorder (ADHD), predominantly inattentive type [id number] CONCERTA 36 MG CR tablet     DISCONTINUED: CONCERTA 36 MG CR tablet     DISCONTINUED: CONCERTA 36 MG CR tablet    Past Psychiatric History: no change  Past Medical History:  Past Medical History:  Diagnosis Date  . Appendicitis     Past Surgical History:  Procedure Laterality Date  . APPENDECTOMY    . TONSILLECTOMY      Family Psychiatric History: no change  Family History: No family history on file.  Social History:  Social History   Social History  . Marital status: Single    Spouse name: N/A  . Number of children: N/A  . Years of education: N/A   Social History Main Topics  . Smoking status: Never Smoker  . Smokeless tobacco: Never Used  . Alcohol use No  . Drug use: No  . Sexual activity: Not on file   Other Topics Concern  . Not on file   Social History Narrative  . No narrative on file    Allergies: No Known Allergies  Metabolic Disorder Labs: No results found for: HGBA1C, MPG No results found for: PROLACTIN No results found for: CHOL, TRIG, HDL, CHOLHDL, VLDL, LDLCALC   Current Medications: Current Outpatient Prescriptions  Medication Sig Dispense Refill  . buPROPion (WELLBUTRIN XL) 300 MG 24 hr tablet Take 1 tablet (300 mg total) by mouth daily. 30 tablet 2  . chlorpheniramine (CHLOR-TRIMETON) 4 MG  tablet Take 4 mg by mouth 2 (two) times daily as needed for allergies.    . clonazePAM (KLONOPIN) 1 MG tablet Take 1 tab daily and 1/2 at bed time 45 tablet 2  . cloNIDine (CATAPRES) 0.1 MG tablet Take 1 tablet (0.1 mg total) by mouth at bedtime. 30 tablet 2  . CONCERTA 36 MG CR tablet Take 1 tablet (36 mg total) by mouth daily. 30 tablet 0  . MELATONIN PO Take 20-25 mg by mouth.     No current facility-administered medications for this visit.     Neurologic: Headache: Negative Seizure: Negative Paresthesias: Negative  Musculoskeletal: Strength & Muscle Tone: within normal limits Gait & Station: normal Patient leans: N/A  Psychiatric Specialty Exam: ROS  Blood pressure 122/76, pulse 75, height 6' (1.829 m), weight 242 lb 12.8 oz (110.1 kg).Body mass index is 32.93 kg/m.  General Appearance: Well Groomed  Eye Contact:  Good  Speech:  Clear and Coherent  Volume:  Normal  Mood:  Euthymic  Affect:  Appropriate  Thought Process:  Coherent  Orientation:  Full (Time, Place, and Person)  Thought Content: Logical   Suicidal Thoughts:  No  Homicidal Thoughts:  No  Memory:  Immediate;   Good Recent;   Good Remote;   Good  Judgement:  Good  Insight:  Good  Psychomotor Activity:  Normal  Concentration:  Concentration: Good  and Attention Span: Good  Recall:  Good  Fund of Knowledge: Good  Language: Good  Akathisia:  Negative  Handed:  Right  AIMS (if indicated):  0  Assets:  Communication Skills Desire for Improvement Financial Resources/Insurance Housing Intimacy Leisure Time Physical Health Resilience Social Support Talents/Skills Transportation Vocational/Educational  ADL's:  Intact  Cognition: WNL  Sleep:  Adequate most nights     Treatment Plan Summary:ADHD continue Concerta 36 mg daily Depression continue Wellbutrin XL 300 mg daily Anxiety:  Continue clonazepam 0.5 mg in am and 1 mg hs Sleep:  Sleep hygiene recommended Trazodone and side effects discussed,  reviewed last note and interim history as well as any side effects and new complaints   Donnelly Angelica, MD 02/26/2016, 9:16 AM

## 2016-03-23 ENCOUNTER — Other Ambulatory Visit (HOSPITAL_COMMUNITY): Payer: Self-pay | Admitting: Psychiatry

## 2016-03-23 DIAGNOSIS — F411 Generalized anxiety disorder: Secondary | ICD-10-CM

## 2016-04-22 ENCOUNTER — Other Ambulatory Visit (HOSPITAL_COMMUNITY): Payer: Self-pay | Admitting: Psychiatry

## 2016-04-22 DIAGNOSIS — F900 Attention-deficit hyperactivity disorder, predominantly inattentive type: Secondary | ICD-10-CM

## 2016-05-21 ENCOUNTER — Other Ambulatory Visit (HOSPITAL_COMMUNITY): Payer: Self-pay | Admitting: Psychiatry

## 2016-05-21 DIAGNOSIS — F900 Attention-deficit hyperactivity disorder, predominantly inattentive type: Secondary | ICD-10-CM

## 2016-05-25 ENCOUNTER — Telehealth (HOSPITAL_COMMUNITY): Payer: Self-pay

## 2016-05-25 ENCOUNTER — Ambulatory Visit (HOSPITAL_COMMUNITY): Payer: Self-pay | Admitting: Psychiatry

## 2016-05-25 ENCOUNTER — Other Ambulatory Visit (HOSPITAL_COMMUNITY): Payer: Self-pay

## 2016-05-25 DIAGNOSIS — F411 Generalized anxiety disorder: Secondary | ICD-10-CM

## 2016-05-25 DIAGNOSIS — F900 Attention-deficit hyperactivity disorder, predominantly inattentive type: Secondary | ICD-10-CM

## 2016-05-25 MED ORDER — BUPROPION HCL ER (XL) 300 MG PO TB24: 300.0000 mg | ORAL_TABLET | Freq: Every day | ORAL | 0 refills | Status: DC

## 2016-05-25 MED ORDER — CLONIDINE HCL 0.1 MG PO TABS: ORAL_TABLET | ORAL | 0 refills | Status: DC

## 2016-05-25 MED ORDER — METHYLPHENIDATE HCL ER (OSM) 36 MG PO TBCR: 36.0000 mg | EXTENDED_RELEASE_TABLET | Freq: Every day | ORAL | 0 refills | Status: DC

## 2016-05-25 NOTE — Telephone Encounter (Signed)
Patient is calling for a refill on his medications, he was last here on 8/23 and sees you on 11/29, Okay to refill? One of his prescriptions is Concerta. Please review and advise, thank you

## 2016-05-25 NOTE — Telephone Encounter (Signed)
Patient is calling for a refill on all medications, he has a follow up on 11/28, but is out now. Please review and advise, thank you - Wellbutrin, Klonopin, Clonidine and Concerta

## 2016-05-26 ENCOUNTER — Other Ambulatory Visit (HOSPITAL_COMMUNITY): Payer: Self-pay | Admitting: Psychiatry

## 2016-06-03 ENCOUNTER — Ambulatory Visit (INDEPENDENT_AMBULATORY_CARE_PROVIDER_SITE_OTHER): Payer: 59 | Admitting: Psychiatry

## 2016-06-03 ENCOUNTER — Encounter (HOSPITAL_COMMUNITY): Payer: Self-pay | Admitting: Psychiatry

## 2016-06-03 DIAGNOSIS — F411 Generalized anxiety disorder: Secondary | ICD-10-CM

## 2016-06-03 DIAGNOSIS — Z79899 Other long term (current) drug therapy: Secondary | ICD-10-CM

## 2016-06-03 DIAGNOSIS — F900 Attention-deficit hyperactivity disorder, predominantly inattentive type: Secondary | ICD-10-CM | POA: Diagnosis not present

## 2016-06-03 MED ORDER — METHYLPHENIDATE HCL ER (OSM) 36 MG PO TBCR: 36.0000 mg | EXTENDED_RELEASE_TABLET | Freq: Every day | ORAL | 0 refills | Status: DC

## 2016-06-03 MED ORDER — CLONAZEPAM 1 MG PO TABS: ORAL_TABLET | ORAL | 2 refills | Status: DC

## 2016-06-03 MED ORDER — CLONIDINE HCL 0.1 MG PO TABS: ORAL_TABLET | ORAL | 2 refills | Status: DC

## 2016-06-03 MED ORDER — BUPROPION HCL ER (XL) 300 MG PO TB24: 300.0000 mg | ORAL_TABLET | Freq: Every day | ORAL | 2 refills | Status: DC

## 2016-06-03 NOTE — Progress Notes (Signed)
[place] Progress Note  James Peters [id number] y.o.  06/03/2016 9:12 AM  Chief Complaint:  I'm thinking to go back to school.  I am doing good.  I'm happy that I continued to lose weight.             History of Present Illness:  James Peters came for his follow-up appointment.  He is doing much better on his current psychiatric medication.  He denies any major panic attack or any irritability.  Sometime he had difficulty sleeping but otherwise he reported no concerns from the medication.  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  He felt that his job is boring and if he do not go back to school then he may switch his job.  He had a good Thanksgiving.  He went to [place] to visit his family.  Patient told it was a busy Thanksgiving but he was very happy to see the relative.  Patient denies any tremors, shakes, irritability, mania or psychosis.  He lost another 10-15 pounds and is happy about it.  He is going regularly to gym and watching his calorie intake.  He admitted that his attention, concentration, energy level is improved from the past.  He denies any illegal substance use.  He lives by himself.  He is currently not in any relationship and he has no children.  Suicidal Ideation: No Plan Formed: No Patient has means to carry out plan: No  Homicidal Ideation: No Plan Formed: No Patient has means to carry out plan: No  Past Psychiatric History/Hospitalization(s): Patient diagnosed with ADHD when he was in school.  He has formal psychological testing.  He is taking stimulant since his his school age.  In the past few years he developed anxiety symptoms and he was given Klonopin and Paxil.  He was seeing Debbora Dus .  Patient denies any history of psychiatric inpatient treatment, paranoia, hallucination, suicidal attempt, mania or any psychosis. Anxiety: Yes Bipolar Disorder: No Depression: Yes Mania: No Psychosis:  No Schizophrenia: No Personality Disorder: No Hospitalization for psychiatric illness: No History of Electroconvulsive Shock Therapy: No Prior Suicide Attempts: No  Medical History; Patient has no active medical problem.  He denies any history of seizures, headaches.  His primary care physician is Dr. Silvestre Mesi.  He had blood work in January 2016.  He has borderline LDL.  Family History; Patient endorse her grandmother has diagnosed with schizophrenia.  Substance Abuse History; Patient admitted history of social drinking which is one beer and one to 2 months.  He denies drinking heavily, intoxication, blackouts or any seizures.  He denies any illegal substance use.    Review of Systems  Constitutional: Positive for weight loss.  HENT: Negative.   Respiratory: Negative.   Cardiovascular: Negative.   Musculoskeletal: Negative.   Skin: Negative.   Neurological: Negative.     Psychiatric: Agitation: No Hallucination: No Depressed Mood: No Insomnia: No Hypersomnia: No Altered Concentration: No Feels Worthless: No Grandiose Ideas: No Belief In Special Powers: No New/Increased Substance Abuse: No Compulsions: No  Neurologic: Headache: No Seizure: No Paresthesias: No   Outpatient Encounter Prescriptions as of 06/03/2016  Medication Sig  . buPROPion (WELLBUTRIN XL) 300 MG 24 hr tablet Take 1 tablet (300 mg total) by mouth daily.  . chlorpheniramine (CHLOR-TRIMETON) 4 MG tablet Take 4 mg by mouth 2 (two) times daily as needed for allergies.  . clonazePAM (KLONOPIN) 1 MG tablet  Take 1 tab daily and 1/2 at bed time  . cloNIDine (CATAPRES) 0.1 MG tablet TAKE 1 TABLET(0.1 MG) BY MOUTH AT BEDTIME  . MELATONIN PO Take 20-25 mg by mouth.  . methylphenidate (CONCERTA) 36 MG PO CR tablet Take 1 tablet (36 mg total) by mouth daily.  . [DISCONTINUED] buPROPion (WELLBUTRIN XL) 300 MG 24 hr tablet Take 1 tablet (300 mg total) by mouth daily.  . [DISCONTINUED] clonazePAM  (KLONOPIN) 1 MG tablet Take 1 tab daily and 1/2 at bed time  . [DISCONTINUED] cloNIDine (CATAPRES) 0.1 MG tablet TAKE 1 TABLET(0.1 MG) BY MOUTH AT BEDTIME  . [DISCONTINUED] methylphenidate (CONCERTA) 36 MG PO CR tablet Take 1 tablet (36 mg total) by mouth daily.  . [DISCONTINUED] methylphenidate (CONCERTA) 36 MG PO CR tablet Take 1 tablet (36 mg total) by mouth daily.  . [DISCONTINUED] methylphenidate (CONCERTA) 36 MG PO CR tablet Take 1 tablet (36 mg total) by mouth daily.   No facility-administered encounter medications on file as of 06/03/2016.     No results found for this or any previous visit (from the past 2160 hour(s)).    Constitutional:  BP 122/74 (BP Location: Left Arm, Patient Position: Sitting, Cuff Size: Normal)   Pulse 96   Ht 6\' 1"  (1.854 m)   Wt 213 lb 6.4 oz (96.8 kg)   BMI 28.15 kg/m    Musculoskeletal: Strength & Muscle Tone: within normal limits Gait & Station: normal Patient leans: N/A  Psychiatric Specialty Exam: General Appearance: Casual and Well Groomed  Engineer, water::  Good  Speech:  Clear and Coherent  Volume:  Normal  Mood:  Euthymic  Affect:  Appropriate and Congruent  Thought Process:  Coherent and Goal Directed  Orientation:  Full (Time, Place, and Person)  Thought Content:  WDL and Logical  Suicidal Thoughts:  No  Homicidal Thoughts:  No  Memory:  Immediate;   Good Recent;   Good Remote;   Good  Judgement:  Good  Insight:  Good  Psychomotor Activity:  Normal  Concentration:  Good  Recall:  Good  Fund of Knowledge:  Good  Language:  Good  Akathisia:  No  Handed:  Right  AIMS (if indicated):     Assets:  Communication Skills Desire for Improvement Financial Resources/Insurance Housing Physical Health Resilience Social Support Talents/Skills Transportation  ADL's:  Intact  Cognition:  WNL  Sleep:        Established Problem, Stable/Improving (1), Review or order clinical lab tests (1), Review of Last Therapy Session (1)  and Review of Medication Regimen & Side Effects (2)  Assessment: Axis I: ADHD, inattentive type.  Anxiety disorder NOS  Axis II: Deferred  Axis III:  Past Medical History:  Diagnosis Date  . Appendicitis      Plan:  Patient is doing better on his current psychiatric medication.  He continued to lose weight by doing exercise , walking and watching his calorie intake.  At this time he does not want to change or reduce his medication since it is working very well.  I will continue Klonopin 0.5 mg in the morning and 1 mg at bedtime, clonidine 0.1 mg at bedtime, Concerta brand name 36 mg daily and Wellbutrin XL 300 mg daily.  Discussed benzodiazepine and stimulant dependence , tolerance and withdrawal.  Follow-up in 3 months.Discussed medication side effects and benefits.  Recommended to call us back if there is any question, concern or worsening of the symptoms.  Discuss safety plan that anytime having active suicidal  thoughts or homicidal thoughts and she need to call 911 or go to the local emergency room.  Nelle Sayed T., MD 06/03/2016

## 2016-06-22 IMAGING — CR DG LUMBAR SPINE COMPLETE 4+V
5 series · 5 of 5 positions shown · non-contrast
Comparison: None.

CLINICAL DATA: Back pain.  Chronic.

EXAM:
LUMBAR SPINE - COMPLETE 4+ VIEW

[rpo]
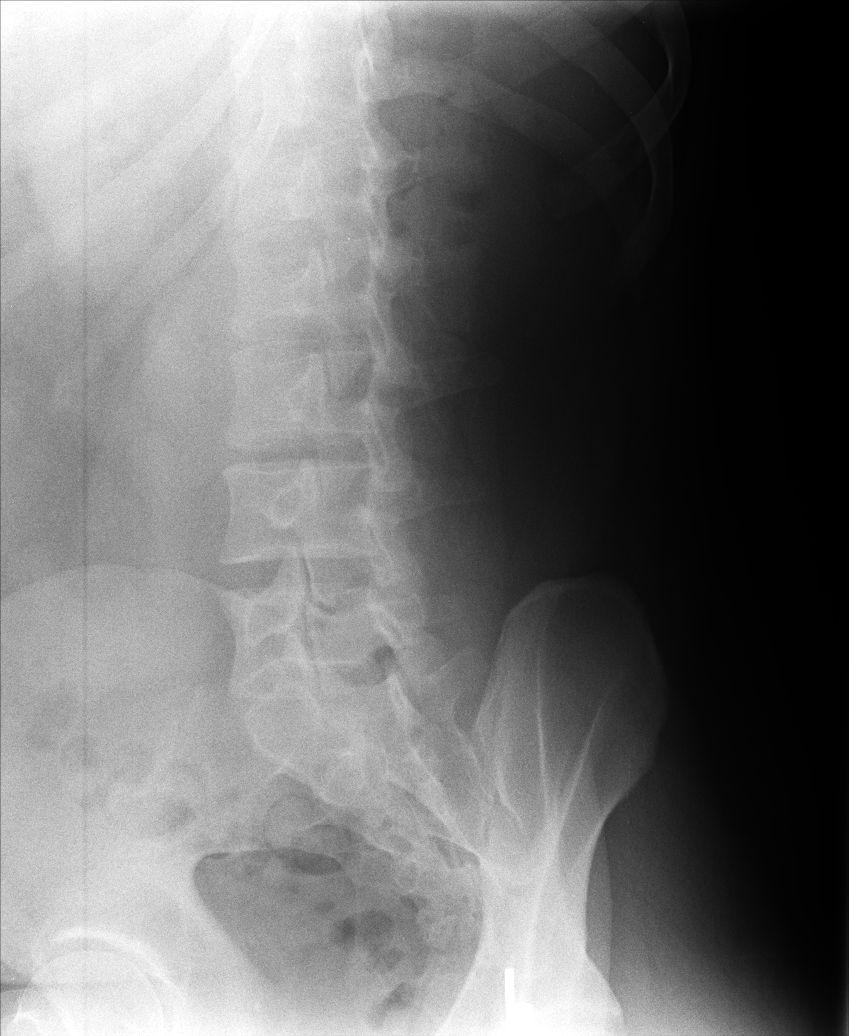

[lpo (1 of 2)]
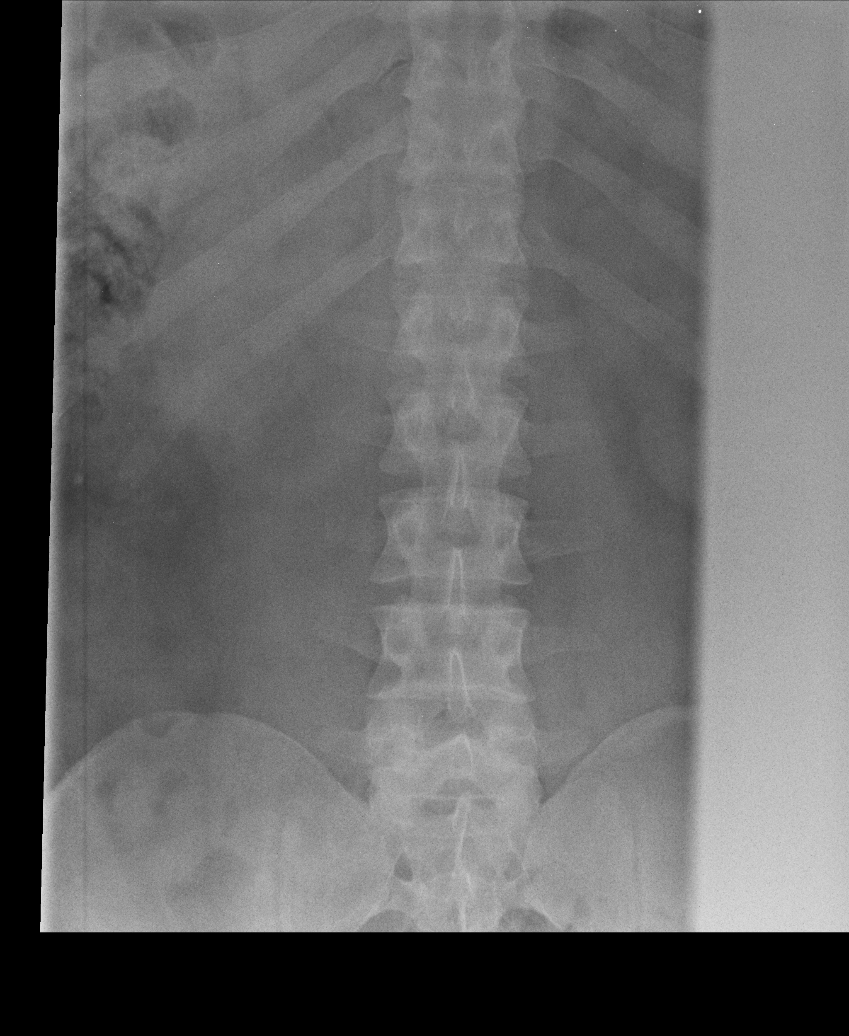

[lateral]
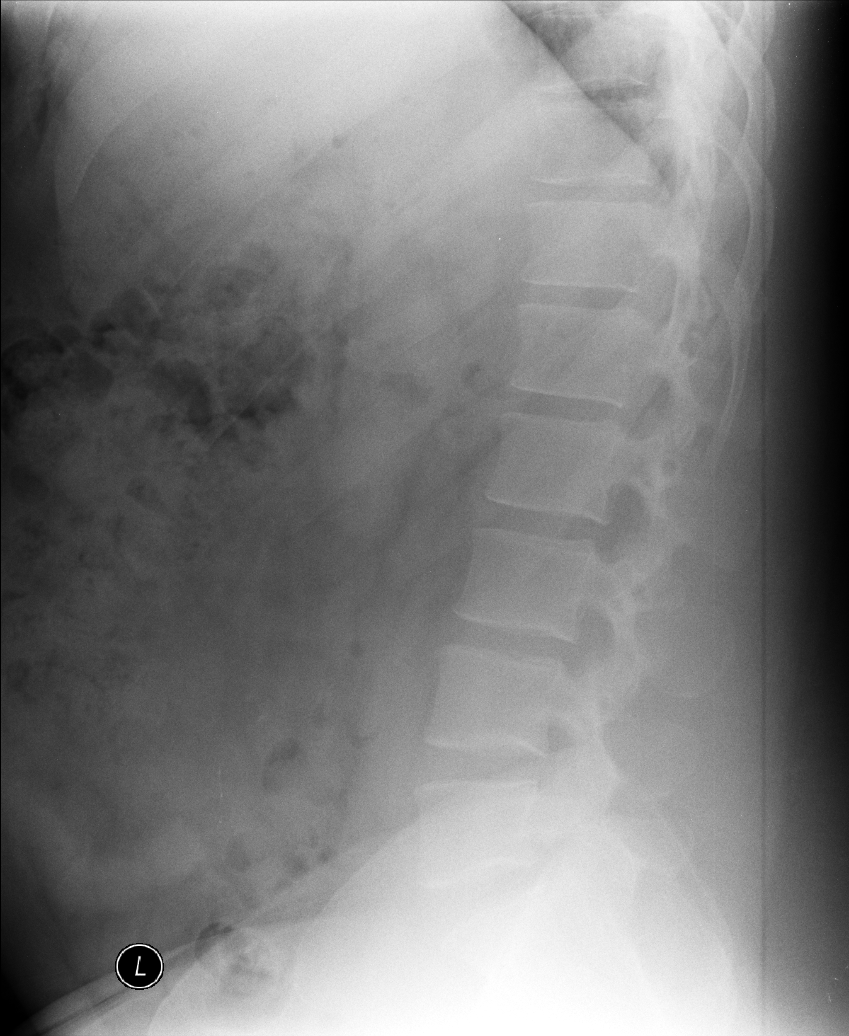

[l5 s1]
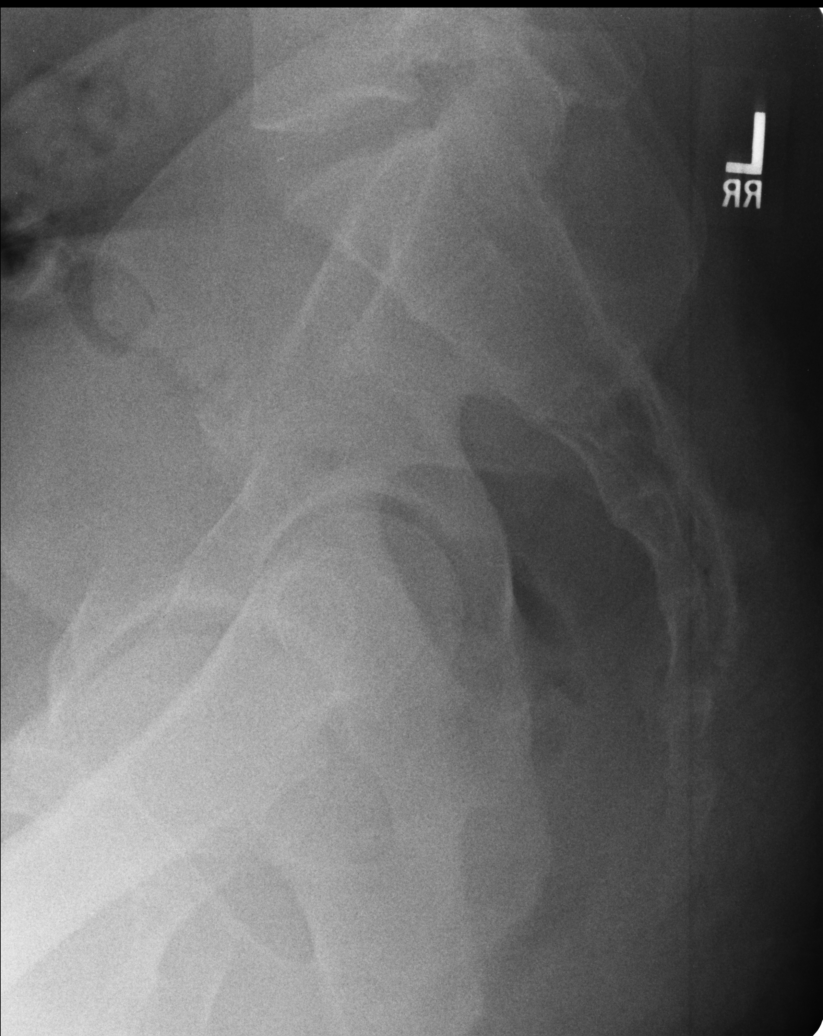

[lpo (2 of 2)]
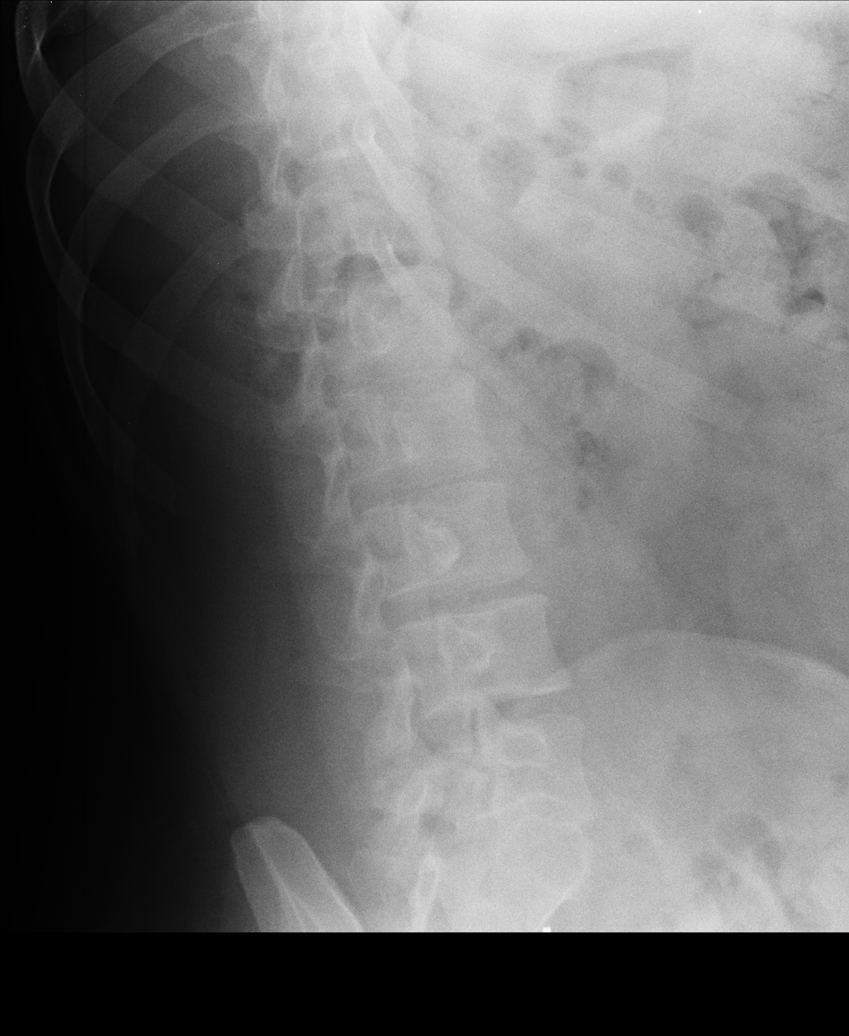

[5 of 5 positions shown; findings below may reference images not displayed]

FINDINGS: Bilateral L5 spondylolysis. No spondylolisthesis. Normal alignment.
Normal mineralization. Paraspinal soft tissues normal.
IMPRESSION: Bilateral L5 spondylolysis, no spondylolisthesis.  Normal alignment.

## 2016-06-25 ENCOUNTER — Other Ambulatory Visit (HOSPITAL_COMMUNITY): Payer: Self-pay | Admitting: Psychiatry

## 2016-06-25 DIAGNOSIS — F411 Generalized anxiety disorder: Secondary | ICD-10-CM

## 2016-07-28 ENCOUNTER — Encounter: Payer: Self-pay | Admitting: Family Medicine

## 2016-09-03 ENCOUNTER — Ambulatory Visit (INDEPENDENT_AMBULATORY_CARE_PROVIDER_SITE_OTHER): Payer: 59 | Admitting: Psychiatry

## 2016-09-03 ENCOUNTER — Encounter (HOSPITAL_COMMUNITY): Payer: Self-pay | Admitting: Psychiatry

## 2016-09-03 DIAGNOSIS — Z79899 Other long term (current) drug therapy: Secondary | ICD-10-CM

## 2016-09-03 DIAGNOSIS — Z9889 Other specified postprocedural states: Secondary | ICD-10-CM | POA: Diagnosis not present

## 2016-09-03 DIAGNOSIS — F900 Attention-deficit hyperactivity disorder, predominantly inattentive type: Secondary | ICD-10-CM | POA: Diagnosis not present

## 2016-09-03 DIAGNOSIS — F411 Generalized anxiety disorder: Secondary | ICD-10-CM | POA: Diagnosis not present

## 2016-09-03 MED ORDER — CLONIDINE HCL 0.1 MG PO TABS: ORAL_TABLET | ORAL | 2 refills | Status: DC

## 2016-09-03 MED ORDER — CLONAZEPAM 1 MG PO TABS: ORAL_TABLET | ORAL | 2 refills | Status: DC

## 2016-09-03 MED ORDER — BUPROPION HCL ER (XL) 300 MG PO TB24: 300.0000 mg | ORAL_TABLET | Freq: Every day | ORAL | 2 refills | Status: DC

## 2016-09-03 MED ORDER — METHYLPHENIDATE HCL ER (OSM) 36 MG PO TBCR: 36.0000 mg | EXTENDED_RELEASE_TABLET | Freq: Every day | ORAL | 0 refills | Status: DC

## 2016-09-03 NOTE — Progress Notes (Signed)
[place] MD/PA/NP OP Progress Note  09/03/2016 8:53 AM James Peters  MRN:  [id number]  Chief Complaint:  Chief Complaint    Follow-up     Subjective:  I am doing better now.  Last month I was in the emergency room because of abdominal pain.  HPI: Patient came for his follow-up appointment.  Patient reported last month he was in the emergency room because of abdominal pain.  He was having nausea, vomiting and he had x-rays in blood test.  He was told that his abdominal is distended and he was given medication.  He is feeling much better.  He is no longer taking nausea medicine.  He is happy that he started school at [employer].  He is studying accounting.  He continues to work at the same place but hoping to switch his job in the future.  He denies any irritability, anger, mania, crying spells.  His obsessive thoughts are stable.  He denies any major panic attack.  He had a good energy level.  He continued to lose weight which she believed due to her exercise and watching his calorie intake.  He denies any paranoia or any hallucination.  His attention consultation is good.  He is able to do multitasking.  Patient denies any illegal substance use.  He lives by himself.  Currently he is not in any relationship and he has no children.    Visit Diagnosis:    ICD-9-CM ICD-10-CM   1. Attention deficit hyperactivity disorder (ADHD), predominantly inattentive type 314.00 F90.0 cloNIDine (CATAPRES) 0.1 MG tablet     methylphenidate (CONCERTA) 36 MG PO CR tablet     DISCONTINUED: methylphenidate (CONCERTA) 36 MG PO CR tablet     DISCONTINUED: methylphenidate (CONCERTA) 36 MG PO CR tablet  2. Generalized anxiety disorder 300.02 F41.1 clonazePAM (KLONOPIN) 1 MG tablet     buPROPion (WELLBUTRIN XL) 300 MG 24 hr tablet    Past Psychiatric History: Reviewed. Patient diagnosed with ADHD when he was in school.  He has formal psychological testing.  He is taking stimulant since his his school age.  In  the past few years he developed anxiety symptoms and he was given Klonopin and Paxil.  He was seeing Debbora Dus .  Patient denies any history of psychiatric inpatient treatment, paranoia, hallucination, suicidal attempt, mania or any psychosis.  Past Medical History:  Past Medical History:  Diagnosis Date  . Appendicitis     Past Surgical History:  Procedure Laterality Date  . APPENDECTOMY    . TONSILLECTOMY      Family Psychiatric History: Reviewed.  Family History: No family history on file.  Social History:  Social History   Social History  . Marital status: Single    Spouse name: N/A  . Number of children: N/A  . Years of education: N/A   Social History Main Topics  . Smoking status: Never Smoker  . Smokeless tobacco: Never Used  . Alcohol use No     Comment: Occasional use  . Drug use: No  . Sexual activity: Not Currently   Other Topics Concern  . None   Social History Narrative  . None    Allergies: No Known Allergies  Metabolic Disorder Labs: No results found for: HGBA1C, MPG No results found for: PROLACTIN No results found for: CHOL, TRIG, HDL, CHOLHDL, VLDL, LDLCALC   Current Medications: Current Outpatient Prescriptions  Medication Sig Dispense Refill  . buPROPion (WELLBUTRIN XL) 300 MG 24 hr tablet Take 1 tablet (  300 mg total) by mouth daily. 30 tablet 2  . chlorpheniramine (CHLOR-TRIMETON) 4 MG tablet Take 4 mg by mouth 2 (two) times daily as needed for allergies.    . clonazePAM (KLONOPIN) 1 MG tablet Take 1 tab daily and 1/2 at bed time 45 tablet 2  . cloNIDine (CATAPRES) 0.1 MG tablet TAKE 1 TABLET(0.1 MG) BY MOUTH AT BEDTIME 30 tablet 2  . MELATONIN PO Take 20-25 mg by mouth.    . methylphenidate (CONCERTA) 36 MG PO CR tablet Take 1 tablet (36 mg total) by mouth daily. 30 tablet 0   No current facility-administered medications for this visit.     Neurologic: Headache: No Seizure: No Paresthesias: No  Musculoskeletal: Strength &  Muscle Tone: within normal limits Gait & Station: normal Patient leans: N/A  Psychiatric Specialty Exam: Review of Systems  Constitutional: Positive for weight loss. Negative for malaise/fatigue.  Skin: Negative.   Neurological: Negative.  Negative for weakness.    Blood pressure 120/72, pulse 91, height 6' 0.5" (1.842 m), weight 203 lb (92.1 kg), SpO2 97 %.Body mass index is 27.15 kg/m.  General Appearance: Casual and Well Groomed  Eye Contact:  Good  Speech:  Clear and Coherent  Volume:  Normal  Mood:  Euthymic  Affect:  Appropriate  Thought Process:  Goal Directed  Orientation:  Full (Time, Place, and Person)  Thought Content: WDL and Logical   Suicidal Thoughts:  No  Homicidal Thoughts:  No  Memory:  Immediate;   Good Recent;   Good Remote;   Good  Judgement:  Good  Insight:  Good  Psychomotor Activity:  Normal  Concentration:  Concentration: Good and Attention Span: Good  Recall:  Good  Fund of Knowledge: Good  Language: Good  Akathisia:  No  Handed:  Right  AIMS (if indicated):  0  Assets:  Communication Skills Desire for Improvement Housing Resilience Social Support Transportation Vocational/Educational  ADL's:  Intact  Cognition: WNL  Sleep:  Good    Assessment: ADHD, inattentive type.  Anxiety disorder NOS  Plan: I review his blood work results which was done in February and he was in the emergency room.  His BUN is 26 and creatinine normal.  His blood sugar was 110.  He has no symptoms of abdominal discomfort.  His attention and concentration is good.  I will continue Concerta 36 mg daily, Wellbutrin XL 300 mg daily, clonidine 0.1 mg at bedtime and Klonopin 0.5 mg in the morning and 1 mg at bedtime.  Discussed medication side effects and benefits.  Recommended to call us back if he has any question, concern or if he feel worsening of the symptom.  Follow-up in 3 months.  Kaizley Aja T., MD 09/03/2016, 8:53 AM

## 2016-12-07 ENCOUNTER — Ambulatory Visit (INDEPENDENT_AMBULATORY_CARE_PROVIDER_SITE_OTHER): Payer: 59 | Admitting: Psychiatry

## 2016-12-07 ENCOUNTER — Encounter (HOSPITAL_COMMUNITY): Payer: Self-pay | Admitting: Psychiatry

## 2016-12-07 DIAGNOSIS — F900 Attention-deficit hyperactivity disorder, predominantly inattentive type: Secondary | ICD-10-CM | POA: Diagnosis not present

## 2016-12-07 DIAGNOSIS — F411 Generalized anxiety disorder: Secondary | ICD-10-CM

## 2016-12-07 MED ORDER — METHYLPHENIDATE HCL ER (OSM) 27 MG PO TBCR: 27.0000 mg | EXTENDED_RELEASE_TABLET | Freq: Every day | ORAL | 0 refills | Status: DC

## 2016-12-07 MED ORDER — CLONAZEPAM 1 MG PO TABS: ORAL_TABLET | ORAL | 2 refills | Status: DC

## 2016-12-07 MED ORDER — METHYLPHENIDATE HCL ER (OSM) 36 MG PO TBCR: 36.0000 mg | EXTENDED_RELEASE_TABLET | Freq: Every day | ORAL | 0 refills | Status: DC

## 2016-12-07 MED ORDER — CLONIDINE HCL 0.1 MG PO TABS: ORAL_TABLET | ORAL | 2 refills | Status: DC

## 2016-12-07 MED ORDER — BUPROPION HCL ER (XL) 300 MG PO TB24: 300.0000 mg | ORAL_TABLET | Freq: Every day | ORAL | 2 refills | Status: DC

## 2016-12-07 NOTE — Progress Notes (Signed)
[place] MD/PA/NP OP Progress Note  12/07/2016 8:41 AM James Peters  MRN:  [id number]  Chief Complaint:  Subjective:  I am doing better.  I stop school because it was not working for me.  HPI: Patient came for his follow-up appointment.  He is taking his medication as prescribed.  He continues to lose weight because he is doing regular exercise and watching his calories.  He stopped going to school because he felt it was not working for him.  He is doing job and now he is thinking about long-term relationship.  He admitted some nights unable to sleep because his thinking too much.  He denies any irritability, anger, mania, psychosis or any other concerns from the medication.  However he feels some time racing thoughts and poor sleep.  We discussed lowering the dose of Concerta and he agreed.  Patient is not drinking or using any illegal substances.  Eventually he like to come off from stimulant.  His energy level is good.  He has no tremors shakes or any chest palpitation.  He lives by himself.  He likes his job.  He is able to do multitasking and his attention concentration is good.  He denies any major panic attack.  Visit Diagnosis:    ICD-9-CM ICD-10-CM   1. Attention deficit hyperactivity disorder (ADHD), predominantly inattentive type 314.00 F90.0 methylphenidate 27 MG PO CR tablet     cloNIDine (CATAPRES) 0.1 MG tablet     DISCONTINUED: methylphenidate (CONCERTA) 36 MG PO CR tablet     DISCONTINUED: methylphenidate (CONCERTA) 36 MG PO CR tablet     DISCONTINUED: methylphenidate 27 MG PO CR tablet     DISCONTINUED: methylphenidate 27 MG PO CR tablet  2. Generalized anxiety disorder 300.02 F41.1 buPROPion (WELLBUTRIN XL) 300 MG 24 hr tablet     clonazePAM (KLONOPIN) 1 MG tablet     Past Psychiatric History: Reviewed. Patient diagnosed with ADHD when he was in school. He has formal psychological testing. He is taking stimulant since his his school age. In the past few years he developed  anxiety symptoms and he was given Klonopin and Paxil. He was seeing Debbora Dus . Patient denies any history of psychiatric inpatient treatment, paranoia, hallucination, suicidal attempt, mania or any psychosis.  Past Medical History:  Past Medical History:  Diagnosis Date  . Appendicitis     Past Surgical History:  Procedure Laterality Date  . APPENDECTOMY    . TONSILLECTOMY      Family Psychiatric History: Reviewed.  Family History: No family history on file.  Social History:  Social History   Social History  . Marital status: Single    Spouse name: N/A  . Number of children: N/A  . Years of education: N/A   Social History Main Topics  . Smoking status: Never Smoker  . Smokeless tobacco: Never Used  . Alcohol use No     Comment: Occasional use  . Drug use: No  . Sexual activity: Not Currently   Other Topics Concern  . Not on file   Social History Narrative  . No narrative on file    Allergies: No Known Allergies  Metabolic Disorder Labs: No results found for: HGBA1C, MPG No results found for: PROLACTIN No results found for: CHOL, TRIG, HDL, CHOLHDL, VLDL, LDLCALC   Current Medications: Current Outpatient Prescriptions  Medication Sig Dispense Refill  . buPROPion (WELLBUTRIN XL) 300 MG 24 hr tablet Take 1 tablet (300 mg total) by mouth daily. 30 tablet 2  .  chlorpheniramine (CHLOR-TRIMETON) 4 MG tablet Take 4 mg by mouth 2 (two) times daily as needed for allergies.    . clonazePAM (KLONOPIN) 1 MG tablet Take 1 tab daily and 1/2 at bed time 45 tablet 2  . cloNIDine (CATAPRES) 0.1 MG tablet TAKE 1 TABLET(0.1 MG) BY MOUTH AT BEDTIME 30 tablet 2  . MELATONIN PO Take 20-25 mg by mouth.    . methylphenidate (CONCERTA) 36 MG PO CR tablet Take 1 tablet (36 mg total) by mouth daily. 30 tablet 0   No current facility-administered medications for this visit.     Neurologic: Headache: No Seizure: No Paresthesias: No  Musculoskeletal: Strength & Muscle Tone:  within normal limits Gait & Station: normal Patient leans: N/A  Psychiatric Specialty Exam: Review of Systems  Constitutional: Positive for weight loss. Negative for malaise/fatigue.  Musculoskeletal: Negative.   Skin: Negative.   Neurological: Negative.  Negative for weakness.  Psychiatric/Behavioral: Negative for suicidal ideas. The patient has insomnia.     Blood pressure 124/70, pulse 98, height 6' 0.5" (1.842 m), weight 198 lb (89.8 kg), SpO2 99 %.There is no height or weight on file to calculate BMI.  General Appearance: Casual  Eye Contact:  Good  Speech:  Clear and Coherent  Volume:  Normal  Mood:  Euthymic  Affect:  Congruent  Thought Process:  Goal Directed  Orientation:  Full (Time, Place, and Person)  Thought Content: WDL and Logical   Suicidal Thoughts:  No  Homicidal Thoughts:  No  Memory:  Immediate;   Good Recent;   Good Remote;   Good  Judgement:  Good  Insight:  Good  Psychomotor Activity:  Normal  Concentration:  Concentration: Good and Attention Span: Good  Recall:  Good  Fund of Knowledge: Good  Language: Good  Akathisia:  No  Handed:  Right  AIMS (if indicated):  0  Assets:  Communication Skills Desire for [place] Talents/Skills Transportation  ADL's:  Intact  Cognition: WNL  Sleep:  Fair     Assessment: ADHD inattentive type.  Anxiety disorder NOS  Plan: We discuss insomnia and weight loss which could be contributing by stimulant.  He like to cut down the dose.  We will try Concerta 27 mg.  He denies any major panic attack.  He is taking Klonopin 0.5 mg in the morning and 1 mg at bedtime.  I will also continue Wellbutrin XL 300 mg daily and clonidine 0.1 mg at bedtime.  Discussed medication side effects and benefits.  He is also taking over-the-counter melatonin.  I recommended to call us back if he has any question, concern or if he feel worsening of the symptom.  Follow-up in 3  months.  Preslyn Warr T., MD 12/07/2016, 8:41 AM

## 2017-01-20 ENCOUNTER — Other Ambulatory Visit (HOSPITAL_COMMUNITY): Payer: Self-pay | Admitting: Psychiatry

## 2017-01-20 DIAGNOSIS — F900 Attention-deficit hyperactivity disorder, predominantly inattentive type: Secondary | ICD-10-CM

## 2017-03-03 ENCOUNTER — Telehealth (HOSPITAL_COMMUNITY): Payer: Self-pay

## 2017-03-03 NOTE — Telephone Encounter (Signed)
Patient is calling because he is wanting to go back up on his Concerta. He has an appointment on Friday and wanted to let you know ahead of time so that you can discuss while he is here.

## 2017-03-05 ENCOUNTER — Ambulatory Visit (INDEPENDENT_AMBULATORY_CARE_PROVIDER_SITE_OTHER): Payer: 59 | Admitting: Psychiatry

## 2017-03-05 ENCOUNTER — Encounter (HOSPITAL_COMMUNITY): Payer: Self-pay | Admitting: Psychiatry

## 2017-03-05 DIAGNOSIS — F411 Generalized anxiety disorder: Secondary | ICD-10-CM | POA: Diagnosis not present

## 2017-03-05 DIAGNOSIS — Z566 Other physical and mental strain related to work: Secondary | ICD-10-CM | POA: Diagnosis not present

## 2017-03-05 DIAGNOSIS — F900 Attention-deficit hyperactivity disorder, predominantly inattentive type: Secondary | ICD-10-CM | POA: Diagnosis not present

## 2017-03-05 MED ORDER — CLONAZEPAM 0.5 MG PO TABS: 0.5000 mg | ORAL_TABLET | Freq: Two times a day (BID) | ORAL | 1 refills | Status: DC

## 2017-03-05 MED ORDER — METHYLPHENIDATE HCL ER (OSM) 36 MG PO TBCR: 36.0000 mg | EXTENDED_RELEASE_TABLET | Freq: Every day | ORAL | 0 refills | Status: DC

## 2017-03-05 MED ORDER — BUPROPION HCL ER (XL) 300 MG PO TB24: 300.0000 mg | ORAL_TABLET | Freq: Every day | ORAL | 1 refills | Status: DC

## 2017-03-05 MED ORDER — CLONIDINE HCL 0.1 MG PO TABS: ORAL_TABLET | ORAL | 1 refills | Status: DC

## 2017-03-05 NOTE — Progress Notes (Signed)
[facility] MD/PA/NP OP Progress Note  03/05/2017 8:20 AM James Peters  MRN:  [id number]  Chief Complaint: My focus and attention is not good.  This new job has a lot of challenges.  I like to go up on Concerta. Chief Complaint    Follow-up     HPI: James Peters came for his follow-up appointment.  He appears stressed because he is having license exam today and he is nervous about it.  He endorse cutting down concern about not helping his focus and attention.  He has difficulty doing multitasking.  He mentioned his new job has a lot of challenges.  Though he is not going to school and we had cut down his Concerta from 36 mg to 27 mg but he has noticed decrease in his attention, concentration, focus and multitasking.  He waited about his work.  He is hoping to clear his license exam today.  Patient works for [employer].  He admitted sometimes difficulty sleeping because he is studying but denies any irritability, anger, mania, psychosis.  He denies any suicidal thoughts or homicidal thought.  He denies drinking alcohol or using any illegal substances.  He has no tremors, shakes or any EPS.  His energy level is fair.  His vital signs are stable.  Is also taking Klonopin, clonidine and Wellbutrin.  Visit Diagnosis:    ICD-10-CM   1. Attention deficit hyperactivity disorder (ADHD), predominantly inattentive type F90.0 cloNIDine (CATAPRES) 0.1 MG tablet    methylphenidate 36 MG PO CR tablet    DISCONTINUED: methylphenidate 36 MG PO CR tablet  2. Generalized anxiety disorder F41.1 buPROPion (WELLBUTRIN XL) 300 MG 24 hr tablet    clonazePAM (KLONOPIN) 0.5 MG tablet    Past Psychiatric History: Reviewed. Patient diagnosed with ADHD when he was in the school.  He has formal psychological testing.  He has been taking stimulants in school age.  In past few years he developed anxiety and he was given Klonopin and Paxil.  He was seeing Metta Clines.  Patient denies any history of psychiatric inpatient treatment,  suicidal attempt, paranoia, hallucination, mania or any psychosis.  Past Medical History:  Past Medical History:  Diagnosis Date  . Appendicitis     Past Surgical History:  Procedure Laterality Date  . APPENDECTOMY    . TONSILLECTOMY      Family Psychiatric History: Reviewed.  Family History: History reviewed. No pertinent family history.  Social History:  Social History   Social History  . Marital status: Single    Spouse name: N/A  . Number of children: N/A  . Years of education: N/A   Social History Main Topics  . Smoking status: Never Smoker  . Smokeless tobacco: Never Used  . Alcohol use No     Comment: Occasional use  . Drug use: No  . Sexual activity: Not Currently   Other Topics Concern  . None   Social History Narrative  . None    Allergies: No Known Allergies  Metabolic Disorder Labs: No results found for: HGBA1C, MPG No results found for: PROLACTIN No results found for: CHOL, TRIG, HDL, CHOLHDL, VLDL, LDLCALC Lab Results  Component Value Date   TSH 2.39 09/28/2015    Therapeutic Level Labs: No results found for: LITHIUM No results found for: VALPROATE No components found for:  CBMZ  Current Medications: Current Outpatient Prescriptions  Medication Sig Dispense Refill  . buPROPion (WELLBUTRIN XL) 300 MG 24 hr tablet Take 1 tablet (300 mg total) by mouth daily. [place]  tablet 2  . chlorpheniramine (CHLOR-TRIMETON) 4 MG tablet Take 4 mg by mouth 2 (two) times daily as needed for allergies.    . clonazePAM (KLONOPIN) 1 MG tablet Take 1 tab daily and 1/2 at bed time 45 tablet 2  . cloNIDine (CATAPRES) 0.1 MG tablet TAKE 1 TABLET(0.1 MG) BY MOUTH AT BEDTIME 30 tablet 2  . MELATONIN PO Take 20-25 mg by mouth.    . methylphenidate 27 MG PO CR tablet Take 1 tablet (27 mg total) by mouth daily. 30 tablet 0   No current facility-administered medications for this visit.      Musculoskeletal: Strength & Muscle Tone: within normal limits Gait &  Station: normal Patient leans: N/A  Psychiatric Specialty Exam: Review of Systems  Constitutional: Negative.   HENT: Negative.   Eyes: Negative.   Respiratory: Negative.   Cardiovascular: Negative.   Gastrointestinal: Negative.   Genitourinary: Negative.   Musculoskeletal: Negative.   Skin: Negative.   Neurological: Negative.   Endo/Heme/Allergies: Negative.     Blood pressure 122/74, pulse 66, height 6\' 1"  (1.854 m), weight 199 lb 12.8 oz (90.6 kg).Body mass index is 26.36 kg/m.  General Appearance: Casual  Eye Contact:  Fair  Speech:  Clear and Coherent  Volume:  Normal  Mood:  Euthymic  Affect:  Congruent  Thought Process:  Goal Directed  Orientation:  Full (Time, Place, and Person)  Thought Content: Rumination   Suicidal Thoughts:  No  Homicidal Thoughts:  No  Memory:  Immediate;   Good Recent;   Good Remote;   Good  Judgement:  Good  Insight:  Good  Psychomotor Activity:  Normal  Concentration:  Concentration: Fair and Attention Span: Fair  Recall:  Good  Fund of Knowledge: Good  Language: Good  Akathisia:  No  Handed:  Right  AIMS (if indicated): not done  Assets:  Communication Skills Desire for Improvement Housing Resilience Talents/Skills Transportation  ADL's:  Intact  Cognition: WNL  Sleep:  Fair   Screenings: PHQ2-9     Office Visit from 09/28/2015 in Primary Care at [facility] from 05/27/2015 in Primary Care at [facility] Total Score  0  0       Assessment and Plan: Attention deficit hyperactivity, inattentive type generalized anxiety disorder  Reassurance given.  Patient like to go back on his original dose of Concerta.  I reminded that it may cause increased anxiety and insomnia but patient afraid that he is not doing his job because lack of focus and attention.  I will increase Concerta 36 mg but reminded that if he developed insomnia and increased anxiety than he may need to reduce the dose.  I will continue Wellbutrin XL 300  mg daily, Klonopin 0. 1 mg at bedtime.  I also recommended to take Klonopin 0.5 mg twice a day.  Discussed medication side effects and benefits.  Discussed benzodiazepine, stimulant abuse tolerance and withdrawal.  Discuss safety concerns that anytime having active suicidal thoughts or homicidal thoughts and he need to call 911 and the local emergency room.  Follow-up in 2 months.  Time spent 25 minutes.  We will do blood work on his next appointment   Harlan Vinal T., MD 03/05/2017, 8:20 AM

## 2017-05-05 ENCOUNTER — Ambulatory Visit (INDEPENDENT_AMBULATORY_CARE_PROVIDER_SITE_OTHER): Payer: 59 | Admitting: Psychiatry

## 2017-05-05 ENCOUNTER — Encounter (HOSPITAL_COMMUNITY): Payer: Self-pay | Admitting: Psychiatry

## 2017-05-05 DIAGNOSIS — F411 Generalized anxiety disorder: Secondary | ICD-10-CM

## 2017-05-05 DIAGNOSIS — F900 Attention-deficit hyperactivity disorder, predominantly inattentive type: Secondary | ICD-10-CM

## 2017-05-05 DIAGNOSIS — Z566 Other physical and mental strain related to work: Secondary | ICD-10-CM | POA: Diagnosis not present

## 2017-05-05 MED ORDER — BUPROPION HCL ER (XL) 300 MG PO TB24: 300.0000 mg | ORAL_TABLET | Freq: Every day | ORAL | 1 refills | Status: DC

## 2017-05-05 MED ORDER — CLONIDINE HCL 0.1 MG PO TABS: ORAL_TABLET | ORAL | 1 refills | Status: DC

## 2017-05-05 MED ORDER — TRAZODONE HCL 100 MG PO TABS: 100.0000 mg | ORAL_TABLET | Freq: Every day | ORAL | 1 refills | Status: DC

## 2017-05-05 MED ORDER — METHYLPHENIDATE HCL ER (OSM) 36 MG PO TBCR: 36.0000 mg | EXTENDED_RELEASE_TABLET | Freq: Every day | ORAL | 0 refills | Status: DC
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
## 2017-06-10 ENCOUNTER — Telehealth (HOSPITAL_COMMUNITY): Payer: Self-pay

## 2017-06-10 NOTE — Telephone Encounter (Signed)
Patient is calling because he said that his focus is still a little off and his anxiety is through the roof. Patient has a follow up on 06/21/17. Please review and advise, thank you

## 2017-06-11 ENCOUNTER — Other Ambulatory Visit (HOSPITAL_COMMUNITY): Payer: Self-pay | Admitting: Psychiatry

## 2017-06-11 NOTE — Telephone Encounter (Signed)
Increase in ADD medication can cause worsening anxiety.  We did discuss in person when he comes for his appointment for adjustment of the medication.

## 2017-06-15 NOTE — Telephone Encounter (Signed)
Medication management - Left patient a messsage that Dr. Adele Schilder did not want to increaese or change any medications until patient is seen on 06/21/17 so he can go over everything with him at that appointment.  Encouraged patient to keep appointment set for 06/21/17 and to call back if any questions prior to then.

## 2017-06-21 ENCOUNTER — Ambulatory Visit (INDEPENDENT_AMBULATORY_CARE_PROVIDER_SITE_OTHER): Payer: 59 | Admitting: Psychiatry

## 2017-06-21 VITALS — BP 126/74 | HR 81 | Ht 73.0 in | Wt 200.0 lb

## 2017-06-21 DIAGNOSIS — F900 Attention-deficit hyperactivity disorder, predominantly inattentive type: Secondary | ICD-10-CM

## 2017-06-21 DIAGNOSIS — F411 Generalized anxiety disorder: Secondary | ICD-10-CM | POA: Diagnosis not present

## 2017-06-21 DIAGNOSIS — Z566 Other physical and mental strain related to work: Secondary | ICD-10-CM | POA: Diagnosis not present

## 2017-06-21 DIAGNOSIS — Z79899 Other long term (current) drug therapy: Secondary | ICD-10-CM | POA: Diagnosis not present

## 2017-06-21 MED ORDER — VENLAFAXINE HCL ER 37.5 MG PO CP24: ORAL_CAPSULE | ORAL | 0 refills | Status: DC

## 2017-06-21 MED ORDER — CLONIDINE HCL 0.1 MG PO TABS: ORAL_TABLET | ORAL | 1 refills | Status: DC

## 2017-06-21 MED ORDER — TRAZODONE HCL 100 MG PO TABS: 100.0000 mg | ORAL_TABLET | Freq: Every day | ORAL | 1 refills | Status: DC

## 2017-06-21 MED ORDER — METHYLPHENIDATE HCL ER (OSM) 36 MG PO TBCR: 36.0000 mg | EXTENDED_RELEASE_TABLET | Freq: Every day | ORAL | 0 refills | Status: DC

## 2017-06-21 NOTE — Progress Notes (Signed)
[place] MD/PA/NP OP Progress Note  06/21/2017 8:07 AM James Peters  MRN:  [id number]  Chief Complaint: I am very nervous and anxious.  My job is very stressful.  HPI: James Peters came for his follow-up appointment.  He had called few times concerning that his anxiety is very high.  He admitted his job is very stressful.  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  So far he has not looking for a new job but if things do not work out very well he is considering for other job.  He admitted anxiety, nervousness and feeling overwhelmed.  We started him on a trazodone which is helping his sleep.  Though he denies any hallucination, paranoia, suicidal thoughts or any feeling of hopelessness or worthlessness but feels some time on the edge at work.  Denies any crying spells or any self abusive behavior.  He has no tremors or shakes.  He is compliant with Catapres, Concerta, Wellbutrin and trazodone.  He is still taking melatonin on and off.  Patient denies drinking alcohol or using any illegal substances.  His appetite is okay.  His energy level is fair.  Patient is hoping to have Christmas with his parents and friends in [place].  Visit Diagnosis:    ICD-10-CM   1. Generalized anxiety disorder F41.1 traZODone (DESYREL) 100 MG tablet    venlafaxine XR (EFFEXOR XR) 37.5 MG 24 hr capsule  2. Attention deficit hyperactivity disorder (ADHD), predominantly inattentive type F90.0 cloNIDine (CATAPRES) 0.1 MG tablet    methylphenidate 36 MG PO CR tablet    DISCONTINUED: methylphenidate 36 MG PO CR tablet    Past Psychiatric History: Reviewed. Patient diagnosed with ADHD when he was in the school. He has formal psychological testing. He has been taking stimulants in school age. In past few years he developed anxiety and he was given Klonopin and Paxil. He was seeing James Peters. Patient denies any history of psychiatric inpatient treatment, suicidal attempt, paranoia,  hallucination, mania or any psychosis.   Past Medical History:  Past Medical History:  Diagnosis Date  . Appendicitis     Past Surgical History:  Procedure Laterality Date  . APPENDECTOMY    . TONSILLECTOMY      Family Psychiatric History: Reviewed.  Family History: No family history on file.  Social History:  Social History   Socioeconomic History  . Marital status: Single    Spouse name: Not on file  . Number of children: Not on file  . Years of education: Not on file  . Highest education level: Not on file  Social Needs  . Financial resource strain: Not on file  . Food insecurity - worry: Not on file  . Food insecurity - inability: Not on file  . Transportation needs - medical: Not on file  . Transportation needs - non-medical: Not on file  Occupational History  . Not on file  Tobacco Use  . Smoking status: Never Smoker  . Smokeless tobacco: Never Used  Substance and Sexual Activity  . Alcohol use: No    Alcohol/week: 0.0 oz    Comment: Occasional use  . Drug use: No  . Sexual activity: Not Currently  Other Topics Concern  . Not on file  Social History Narrative  . Not on file    Allergies: No Known Allergies  Metabolic Disorder Labs: No results found for: HGBA1C, MPG No results found for: PROLACTIN No results found for: CHOL, TRIG,  HDL, CHOLHDL, VLDL, LDLCALC Lab Results  Component Value Date   TSH 2.39 09/28/2015    Therapeutic Level Labs: No results found for: LITHIUM No results found for: VALPROATE No components found for:  CBMZ  Current Medications: Current Outpatient Medications  Medication Sig Dispense Refill  . cloNIDine (CATAPRES) 0.1 MG tablet TAKE 1 TABLET(0.1 MG) BY MOUTH AT BEDTIME 30 tablet 1  . MELATONIN PO Take 20-25 mg by mouth.    . methylphenidate 36 MG PO CR tablet Take 1 tablet (36 mg total) by mouth daily. 30 tablet 0  . traZODone (DESYREL) 100 MG tablet Take 1 tablet (100 mg total) by mouth at bedtime. 30 tablet 1  .  venlafaxine XR (EFFEXOR XR) 37.5 MG 24 hr capsule Take one capsule daily for 1 week and than twice daily 60 capsule 0   No current facility-administered medications for this visit.      Musculoskeletal: Strength & Muscle Tone: within normal limits Gait & Station: normal Patient leans: N/A  Psychiatric Specialty Exam: Review of Systems  Constitutional: Negative.   HENT: Negative.   Respiratory: Negative.   Musculoskeletal: Negative.   Skin: Negative.   Neurological: Negative.   Psychiatric/Behavioral: The patient is nervous/anxious.     Blood pressure 126/74, pulse 81, height 6\' 1"  (1.854 m), weight 200 lb (90.7 kg).There is no height or weight on file to calculate BMI.  General Appearance: Casual  Eye Contact:  Good  Speech:  Clear and Coherent  Volume:  Normal  Mood:  Anxious  Affect:  Constricted  Thought Process:  Goal Directed  Orientation:  Full (Time, Place, and Person)  Thought Content: Rumination   Suicidal Thoughts:  No  Homicidal Thoughts:  No  Memory:  Immediate;   Good Recent;   Good Remote;   Good  Judgement:  Good  Insight:  Good  Psychomotor Activity:  Decreased  Concentration:  Concentration: Fair and Attention Span: Fair  Recall:  Good  Fund of Knowledge: Good  Language: Good  Akathisia:  No  Handed:  Right  AIMS (if indicated): not done  Assets:  Communication Skills Desire for Improvement Housing Resilience Talents/Skills Transportation  ADL's:  Intact  Cognition: WNL  Sleep:  Fair   Screenings: PHQ2-9     Office Visit from 09/28/2015 in Primary Care at [facility] from 05/27/2015 in Primary Care at [facility] Total Score  0  0       Assessment and Plan: Attention deficit disorder, inattentive type.  Generalized anxiety disorder.  Patient is experiencing increased anxiety and nervousness.  He is sleeping better with trazodone but continued to feel overwhelmed with the work.  Recommended to try Effexor which he has not done  before.  We will discontinue Wellbutrin.  Continue Concerta at present dose.  Discussed increasing Concerta may cause worsening of anxiety.  He will start Effexor 37.5 mg daily for 1 week and then 75 mg a day.  Discussed medication side effects and benefits.  Continue clonidine 0.5 mg daily and trazodone 100 mg at bedtime.  I also discussed that he should see a therapist in this office for anxiety symptoms.  Encourage daily exercise and healthy lifestyle.  We will also do blood work today including CBC, CMP, TSH and hemoglobin A1c.  Recommended to call us back if he has any question, concern or if you feel worsening of the symptoms.  Follow-up in 6 weeks.   Kathlee Nations, MD 06/21/2017, 8:07 AM

## 2017-06-22 LAB — COMPREHENSIVE METABOLIC PANEL
ALK PHOS: 52 IU/L (ref 39–117)
ALT: 25 IU/L (ref 0–44)
AST: 42 IU/L — AB (ref 0–40)
Albumin/Globulin Ratio: 2 (ref 1.2–2.2)
Albumin: 4.7 g/dL (ref 3.5–5.5)
BUN/Creatinine Ratio: 14 (ref 9–20)
BUN: 16 mg/dL (ref 6–20)
Bilirubin Total: 1.1 mg/dL (ref 0.0–1.2)
CALCIUM: 10.1 mg/dL (ref 8.7–10.2)
CO2: 24 mmol/L (ref 20–29)
CREATININE: 1.18 mg/dL (ref 0.76–1.27)
Chloride: 104 mmol/L (ref 96–106)
GFR calc Af Amer: 97 mL/min/{1.73_m2} (ref >59)
GFR calc non Af Amer: 84 mL/min/{1.73_m2} (ref >59)
GLUCOSE: 79 mg/dL (ref 65–99)
Globulin, Total: 2.4 g/dL (ref 1.5–4.5)
Potassium: 5 mmol/L (ref 3.5–5.2)
Sodium: 143 mmol/L (ref 134–144)
Total Protein: 7.1 g/dL (ref 6.0–8.5)

## 2017-06-22 LAB — CBC WITH DIFFERENTIAL/PLATELET
BASOS ABS: 0 10*3/uL (ref 0.0–0.2)
Basos: 0 %
EOS (ABSOLUTE): 0.1 10*3/uL (ref 0.0–0.4)
Eos: 2 %
HEMOGLOBIN: 15.2 g/dL (ref 13.0–17.7)
Hematocrit: 44.7 % (ref 37.5–51.0)
IMMATURE GRANULOCYTES: 0 %
Immature Grans (Abs): 0 10*3/uL (ref 0.0–0.1)
LYMPHS ABS: 1.6 10*3/uL (ref 0.7–3.1)
Lymphs: 33 %
MCH: 31 pg (ref 26.6–33.0)
MCHC: 34 g/dL (ref 31.5–35.7)
MCV: 91 fL (ref 79–97)
MONOCYTES: 6 %
MONOS ABS: 0.3 10*3/uL (ref 0.1–0.9)
NEUTROS PCT: 59 %
Neutrophils Absolute: 2.7 10*3/uL (ref 1.4–7.0)
Platelets: 236 10*3/uL (ref 150–379)
RBC: 4.9 x10E6/uL (ref 4.14–5.80)
RDW: 12.6 % (ref 12.3–15.4)
WBC: 4.7 10*3/uL (ref 3.4–10.8)

## 2017-06-22 LAB — TSH: TSH: 1.29 u[IU]/mL (ref 0.450–4.500)

## 2017-06-22 LAB — HEMOGLOBIN A1C
ESTIMATED AVERAGE GLUCOSE: 100 mg/dL
Hgb A1c MFr Bld: 5.1 % (ref 4.8–5.6)

## 2017-07-15 ENCOUNTER — Ambulatory Visit (HOSPITAL_COMMUNITY): Payer: Self-pay | Admitting: Licensed Clinical Social Worker

## 2017-08-02 ENCOUNTER — Other Ambulatory Visit (HOSPITAL_COMMUNITY): Payer: Self-pay

## 2017-08-02 DIAGNOSIS — F411 Generalized anxiety disorder: Secondary | ICD-10-CM

## 2017-08-02 DIAGNOSIS — F900 Attention-deficit hyperactivity disorder, predominantly inattentive type: Secondary | ICD-10-CM

## 2017-08-02 MED ORDER — METHYLPHENIDATE HCL ER (OSM) 36 MG PO TBCR: 36.0000 mg | EXTENDED_RELEASE_TABLET | Freq: Every day | ORAL | 0 refills | Status: DC

## 2017-08-02 MED ORDER — CLONIDINE HCL 0.1 MG PO TABS: ORAL_TABLET | ORAL | 1 refills | Status: DC

## 2017-08-02 MED ORDER — VENLAFAXINE HCL ER 37.5 MG PO CP24: ORAL_CAPSULE | ORAL | 0 refills | Status: DC

## 2017-08-02 MED ORDER — TRAZODONE HCL 100 MG PO TABS: 100.0000 mg | ORAL_TABLET | Freq: Every day | ORAL | 1 refills | Status: DC

## 2017-08-03 ENCOUNTER — Ambulatory Visit (INDEPENDENT_AMBULATORY_CARE_PROVIDER_SITE_OTHER): Payer: 59 | Admitting: Psychiatry

## 2017-08-03 ENCOUNTER — Encounter (HOSPITAL_COMMUNITY): Payer: Self-pay | Admitting: Psychiatry

## 2017-08-03 DIAGNOSIS — F411 Generalized anxiety disorder: Secondary | ICD-10-CM | POA: Diagnosis not present

## 2017-08-03 DIAGNOSIS — F900 Attention-deficit hyperactivity disorder, predominantly inattentive type: Secondary | ICD-10-CM | POA: Diagnosis not present

## 2017-08-03 MED ORDER — VENLAFAXINE HCL ER 75 MG PO CP24: ORAL_CAPSULE | ORAL | 1 refills | Status: DC

## 2017-08-03 NOTE — Progress Notes (Signed)
BH MD/PA/NP OP Progress Note  08/03/2017 8:16 AM James Peters  MRN:  [id number]  Chief Complaint: I like new medication.  I am feeling less anxious.  HPI: Bunyan came for his follow-up appointment.  On his last visit we started him on Effexor and discontinue Wellbutrin.  He is feeling much better.  He is taking 75 mg every day.  He is tolerating the medication and denies any side effects.  He is sleeping better.  He had a good Christmas.  Today he is somewhat congested because of allergies but he denies any irritability, feeling of hopelessness or worthlessness.  He denies any suicidal thoughts or any crying spells.  He endorsed new medication helping his anxiety and depression.  His energy level is good.  He gained 17 pounds in 1 month and he admitted that he is using supplements to increase muscle mass.  He takes melatonin on and off.  He is compliant with Catapres, Concerta, trazodone and Effexor.  His attention, concentration and multitasking is good.  He has no rash, itching tremors shakes or any EPS.  Patient denies drinking alcohol or using any illegal substances.  He had blood work on his last visit and his CBC, chemistry, hemoglobin A1c and TSH is normal.  Patient went to [place] to spend Christmas with his parents.  Visit Diagnosis:    ICD-10-CM   1. Generalized anxiety disorder F41.1 venlafaxine XR (EFFEXOR-XR) 75 MG 24 hr capsule    Past Psychiatric History: Reviewed. Patient diagnosed with ADHD when he was in the school. He had psychological testing. He is taking stimulants since school age. In past few years he developed anxiety and he was given Klonopin and Paxil. He was seeing Metta Clines. Patient denies any history of psychiatric inpatient treatment, suicidal attempt, paranoia, hallucination, mania or any psychosis he took Wellbutrin for a while but it stopped working.  Past Medical History:  Past Medical History:  Diagnosis Date  . Appendicitis     Past  Surgical History:  Procedure Laterality Date  . APPENDECTOMY    . TONSILLECTOMY      Family Psychiatric History: Reviewed.  Family History: History reviewed. No pertinent family history.  Social History:  Social History   Socioeconomic History  . Marital status: Single    Spouse name: None  . Number of children: None  . Years of education: None  . Highest education level: None  Social Needs  . Financial resource strain: None  . Food insecurity - worry: None  . Food insecurity - inability: None  . Transportation needs - medical: None  . Transportation needs - non-medical: None  Occupational History  . None  Tobacco Use  . Smoking status: Never Smoker  . Smokeless tobacco: Never Used  Substance and Sexual Activity  . Alcohol use: No    Alcohol/week: 0.0 oz    Comment: Occasional use  . Drug use: No  . Sexual activity: Not Currently  Other Topics Concern  . None  Social History Narrative  . None    Allergies: No Known Allergies  Recent Results (from the past 2160 hour(s))  TSH     Status: None   Collection Time: 06/21/17  8:10 AM  Result Value Ref Range   TSH 1.290 0.450 - 4.500 uIU/mL  Hemoglobin A1c     Status: None   Collection Time: 06/21/17  8:10 AM  Result Value Ref Range   Hgb A1c MFr Bld 5.1 4.8 - 5.6 %    Comment:  Prediabetes: 5.7 - 6.4          Diabetes: >6.4          Glycemic control for adults with diabetes: <7.0    Est. average glucose Bld gHb Est-mCnc 100 mg/dL  CBC with Differential     Status: None   Collection Time: 06/21/17  8:10 AM  Result Value Ref Range   WBC 4.7 3.4 - 10.8 x10E3/uL   RBC 4.90 4.14 - 5.80 x10E6/uL   Hemoglobin 15.2 13.0 - 17.7 g/dL   Hematocrit 44.7 37.5 - 51.0 %   MCV 91 79 - 97 fL   MCH 31.0 26.6 - 33.0 pg   MCHC 34.0 31.5 - 35.7 g/dL   RDW 12.6 12.3 - 15.4 %   Platelets 236 150 - 379 x10E3/uL   Neutrophils 59 Not Estab. %   Lymphs 33 Not Estab. %   Monocytes 6 Not Estab. %   Eos 2 Not Estab. %    Basos 0 Not Estab. %   Neutrophils Absolute 2.7 1.4 - 7.0 x10E3/uL   Lymphocytes Absolute 1.6 0.7 - 3.1 x10E3/uL   Monocytes Absolute 0.3 0.1 - 0.9 x10E3/uL   EOS (ABSOLUTE) 0.1 0.0 - 0.4 x10E3/uL   Basophils Absolute 0.0 0.0 - 0.2 x10E3/uL   Immature Granulocytes 0 Not Estab. %   Immature Grans (Abs) 0.0 0.0 - 0.1 x10E3/uL  Comprehensive metabolic panel     Status: Abnormal   Collection Time: 06/21/17  8:10 AM  Result Value Ref Range   Glucose 79 65 - 99 mg/dL   BUN 16 6 - 20 mg/dL   Creatinine, Ser 1.18 0.76 - 1.27 mg/dL   GFR calc non Af Amer 84 >59 mL/min/1.73   GFR calc Af Amer 97 >59 mL/min/1.73   BUN/Creatinine Ratio 14 9 - 20   Sodium 143 134 - 144 mmol/L   Potassium 5.0 3.5 - 5.2 mmol/L   Chloride 104 96 - 106 mmol/L   CO2 24 20 - 29 mmol/L   Calcium 10.1 8.7 - 10.2 mg/dL   Total Protein 7.1 6.0 - 8.5 g/dL   Albumin 4.7 3.5 - 5.5 g/dL   Globulin, Total 2.4 1.5 - 4.5 g/dL   Albumin/Globulin Ratio 2.0 1.2 - 2.2   Bilirubin Total 1.1 0.0 - 1.2 mg/dL   Alkaline Phosphatase 52 39 - 117 IU/L   AST 42 (H) 0 - 40 IU/L   ALT 25 0 - 44 IU/L   Metabolic Disorder Labs: Lab Results  Component Value Date   HGBA1C 5.1 06/21/2017   No results found for: PROLACTIN No results found for: CHOL, TRIG, HDL, CHOLHDL, VLDL, LDLCALC Lab Results  Component Value Date   TSH 1.290 06/21/2017   TSH 2.39 09/28/2015    Therapeutic Level Labs: No results found for: LITHIUM No results found for: VALPROATE No components found for:  CBMZ  Current Medications: Current Outpatient Medications  Medication Sig Dispense Refill  . cloNIDine (CATAPRES) 0.1 MG tablet TAKE 1 TABLET(0.1 MG) BY MOUTH AT BEDTIME 30 tablet 1  . MELATONIN PO Take 20-25 mg by mouth.    . methylphenidate 36 MG PO CR tablet Take 1 tablet (36 mg total) by mouth daily. 30 tablet 0  . traZODone (DESYREL) 100 MG tablet Take 1 tablet (100 mg total) by mouth at bedtime. 30 tablet 1  . venlafaxine XR (EFFEXOR XR) 37.5 MG 24 hr  capsule Take one capsule daily for 1 week and than twice daily 60 capsule 0   No  current facility-administered medications for this visit.      Musculoskeletal: Strength & Muscle Tone: within normal limits Gait & Station: normal Patient leans: N/A  Psychiatric Specialty Exam: ROS  Blood pressure 124/72, pulse 86, height 6\' 1"  (1.854 m), weight 217 lb (98.4 kg).Body mass index is 28.63 kg/m.  General Appearance: Casual  Eye Contact:  Good  Speech:  Clear and Coherent  Volume:  Normal  Mood:  Euthymic  Affect:  Appropriate  Thought Process:  Goal Directed  Orientation:  Full (Time, Place, and Person)  Thought Content: Logical   Suicidal Thoughts:  No  Homicidal Thoughts:  No  Memory:  Immediate;   Good Recent;   Good Remote;   Good  Judgement:  Good  Insight:  Good  Psychomotor Activity:  Normal  Concentration:  Concentration: Good and Attention Span: Good  Recall:  Good  Fund of Knowledge: Good  Language: Good  Akathisia:  No  Handed:  Right  AIMS (if indicated): not done  Assets:  Communication Skills Desire for Improvement Housing Resilience  ADL's:  Intact  Cognition: WNL  Sleep:  Good   Screenings: PHQ2-9     Office Visit from 09/28/2015 in Primary Care at [facility] from 05/27/2015 in Primary Care at [facility] Total Score  0  0       Assessment and Plan: Attention deficit disorder, inattentive type.  Generalized anxiety disorder.  Patient doing better on Effexor.  I also reviewed his blood work results.  His hemoglobin A1c, CBC, chemistry and TSH is normal.  Patient does not feel he need counseling since medicine working.  I will continue Effexor X are 75 mg daily, clonidine 0.5 mg daily, trazodone 100 mg at bedtime and Concerta 36 mg daily.  Discussed medication side effects and benefits.  He has no tremors, shakes or any EPS.  Recommended to call us back if is any question or any concern.  Follow-up in 2 months.    Kathlee Nations,  MD 08/03/2017, 8:16 AM

## 2017-08-31 ENCOUNTER — Telehealth (HOSPITAL_COMMUNITY): Payer: Self-pay

## 2017-08-31 ENCOUNTER — Other Ambulatory Visit (HOSPITAL_COMMUNITY): Payer: Self-pay | Admitting: Psychiatry

## 2017-08-31 DIAGNOSIS — F900 Attention-deficit hyperactivity disorder, predominantly inattentive type: Secondary | ICD-10-CM

## 2017-08-31 MED ORDER — METHYLPHENIDATE HCL ER (OSM) 36 MG PO TBCR: 36.0000 mg | EXTENDED_RELEASE_TABLET | Freq: Every day | ORAL | 0 refills | Status: DC

## 2017-08-31 NOTE — Telephone Encounter (Signed)
Patient is calling for a refill on his Concerta, he has a follow up in March Please review and advise, thank you

## 2017-08-31 NOTE — Telephone Encounter (Signed)
Refill done for 30 days

## 2017-09-04 ENCOUNTER — Other Ambulatory Visit (HOSPITAL_COMMUNITY): Payer: Self-pay

## 2017-09-04 DIAGNOSIS — F411 Generalized anxiety disorder: Secondary | ICD-10-CM

## 2017-09-04 MED ORDER — VENLAFAXINE HCL ER 75 MG PO CP24: ORAL_CAPSULE | ORAL | 0 refills | Status: DC

## 2017-09-30 ENCOUNTER — Encounter (HOSPITAL_COMMUNITY): Payer: Self-pay | Admitting: Psychiatry

## 2017-09-30 ENCOUNTER — Ambulatory Visit (INDEPENDENT_AMBULATORY_CARE_PROVIDER_SITE_OTHER): Payer: 59 | Admitting: Psychiatry

## 2017-09-30 DIAGNOSIS — F411 Generalized anxiety disorder: Secondary | ICD-10-CM

## 2017-09-30 DIAGNOSIS — F900 Attention-deficit hyperactivity disorder, predominantly inattentive type: Secondary | ICD-10-CM | POA: Diagnosis not present

## 2017-09-30 MED ORDER — METHYLPHENIDATE HCL ER (OSM) 36 MG PO TBCR: 36.0000 mg | EXTENDED_RELEASE_TABLET | Freq: Every day | ORAL | 0 refills | Status: DC

## 2017-09-30 MED ORDER — VENLAFAXINE HCL ER 75 MG PO CP24: ORAL_CAPSULE | ORAL | 2 refills | Status: DC

## 2017-09-30 MED ORDER — CLONIDINE HCL 0.1 MG PO TABS: ORAL_TABLET | ORAL | 2 refills | Status: DC

## 2017-09-30 MED ORDER — TRAZODONE HCL 100 MG PO TABS: 100.0000 mg | ORAL_TABLET | Freq: Every day | ORAL | 2 refills | Status: DC

## 2017-09-30 NOTE — Progress Notes (Signed)
BH MD/PA/NP OP Progress Note  09/30/2017 8:10 AM James Peters  MRN:  [id number]  Chief Complaint: My job is very stressful.  I am hoping to move back to my old department.  HPI: James Peters came for his follow-up appointment.  He is taking his medication as prescribed.  He admitted job is very stressful and he is hoping to move back to his old apartment.  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  Patient works in [employer].  He feels medicine working.  He is able to do his multitasking and his attention and concentration is okay.  He denies any irritability, anger, mania or any psychosis.  He stopped using supplements and he had lost weight from the past.  He is sleeping good.  There are nights we had difficulty sleeping and he had racing thoughts.  He does not want to change his medication.  Patient denies any suicidal thoughts or homicidal thought.  He has no tremors, shakes or any EPS.  He is not drinking alcohol or using any illegal substances.  He is taking Catapres, Concerta, trazodone and Effexor.  His appetite is okay.  His vital signs are normal.  He has no chest pain, palpitation or any tremors.  Visit Diagnosis:    ICD-10-CM   1. Attention deficit hyperactivity disorder (ADHD), predominantly inattentive type F90.0 methylphenidate 36 MG PO CR tablet    cloNIDine (CATAPRES) 0.1 MG tablet  2. Generalized anxiety disorder F41.1 venlafaxine XR (EFFEXOR-XR) 75 MG 24 hr capsule    traZODone (DESYREL) 100 MG tablet    Past Psychiatric History: Reviewed Patient diagnosed with ADHD when he was in the school. He had psychological testing. He is taking stimulants since school age. In past few years he developed anxiety and he was given Klonopin and Paxil. He was seeing Metta Clines. Patient denies any history of psychiatric inpatient treatment, suicidal attempt, paranoia, hallucination, mania or any psychosis he took Wellbutrin for a while but it stopped  working.   Past Medical History:  Past Medical History:  Diagnosis Date  . Appendicitis     Past Surgical History:  Procedure Laterality Date  . APPENDECTOMY    . TONSILLECTOMY      Family Psychiatric History: Viewed.  Family History: No family history on file.  Social History:  Social History   Socioeconomic History  . Marital status: Single    Spouse name: Not on file  . Number of children: Not on file  . Years of education: Not on file  . Highest education level: Not on file  Occupational History  . Not on file  Social Needs  . Financial resource strain: Not on file  . Food insecurity:    Worry: Not on file    Inability: Not on file  . Transportation needs:    Medical: Not on file    Non-medical: Not on file  Tobacco Use  . Smoking status: Never Smoker  . Smokeless tobacco: Never Used  Substance and Sexual Activity  . Alcohol use: No    Alcohol/week: 0.0 oz    Comment: Occasional use  . Drug use: No  . Sexual activity: Not Currently  Lifestyle  . Physical activity:    Days per week: Not on file    Minutes per session: Not on file  . Stress: Not on file  Relationships  . Social connections:    Talks on phone: Not on file    Gets together: Not on  file    Attends religious service: Not on file    Active member of club or organization: Not on file    Attends meetings of clubs or organizations: Not on file    Relationship status: Not on file  Other Topics Concern  . Not on file  Social History Narrative  . Not on file    Allergies: No Known Allergies  Metabolic Disorder Labs: Lab Results  Component Value Date   HGBA1C 5.1 06/21/2017   No results found for: PROLACTIN No results found for: CHOL, TRIG, HDL, CHOLHDL, VLDL, LDLCALC Lab Results  Component Value Date   TSH 1.290 06/21/2017   TSH 2.39 09/28/2015    Therapeutic Level Labs: No results found for: LITHIUM No results found for: VALPROATE No components found for:  CBMZ  Current  Medications: Current Outpatient Medications  Medication Sig Dispense Refill  . cloNIDine (CATAPRES) 0.1 MG tablet TAKE 1 TABLET(0.1 MG) BY MOUTH AT BEDTIME 30 tablet 1  . MELATONIN PO Take 20-25 mg by mouth.    . methylphenidate 36 MG PO CR tablet Take 1 tablet (36 mg total) by mouth daily. 30 tablet 0  . traZODone (DESYREL) 100 MG tablet Take 1 tablet (100 mg total) by mouth at bedtime. 30 tablet 1  . venlafaxine XR (EFFEXOR-XR) 75 MG 24 hr capsule Take one capsule daily 30 capsule 0   No current facility-administered medications for this visit.      Musculoskeletal: Strength & Muscle Tone: within normal limits Gait & Station: normal Patient leans: N/A  Psychiatric Specialty Exam: ROS  Blood pressure 136/85, pulse 86, height 6\' 1"  (1.854 m), weight 222 lb 9.6 oz (101 kg), SpO2 98 %.Body mass index is 29.37 kg/m.  General Appearance: Casual  Eye Contact:  Fair  Speech:  Clear and Coherent  Volume:  Normal  Mood:  Euthymic  Affect:  Appropriate  Thought Process:  Goal Directed  Orientation:  Full (Time, Place, and Person)  Thought Content: Logical   Suicidal Thoughts:  No  Homicidal Thoughts:  No  Memory:  Immediate;   Good Recent;   Good Remote;   Good  Judgement:  Good  Insight:  Good  Psychomotor Activity:  Normal  Concentration:  Concentration: Fair and Attention Span: Fair  Recall:  Good  Fund of Knowledge: Good  Language: Good  Akathisia:  No  Handed:  Right  AIMS (if indicated): not done  Assets:  Communication Skills Desire for Improvement Housing Resilience Social Support  ADL's:  Intact  Cognition: WNL  Sleep:  Fair   Screenings: PHQ2-9     Office Visit from 09/28/2015 in Primary Care at [facility] from 05/27/2015 in Primary Care at [facility] Total Score  0  0       Assessment and Plan: Attention deficit disorder, inattentive type.  Generalized anxiety disorder.  Reassurance given.  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  He does not want to change medication.  I offered counseling but patient declined.  I will continue Effexor XR 5 mg daily, clonidine 0.5 mg daily, trazodone 100 mg at bedtime and Concerta 36 mg daily.  Discussed medication side effects and benefits.  Discussed stimulant abuse, tolerance and withdrawal.  Recommended to call us back if he has any question or any concern.  Follow-up in 3 months.   Kathlee Nations, MD 09/30/2017, 8:10 AM

## 2017-10-19 ENCOUNTER — Other Ambulatory Visit (HOSPITAL_COMMUNITY): Payer: Self-pay | Admitting: Psychiatry

## 2017-10-19 ENCOUNTER — Telehealth (HOSPITAL_COMMUNITY): Payer: Self-pay

## 2017-10-19 NOTE — Telephone Encounter (Signed)
I returned patient's phone call.  He is very stressed about his job.  He feels overwhelmed and anxious.  He is thinking to switch his job.  He does not want to change or increase his medication but willing to see a therapist.  He denies any suicidal thoughts but promised if symptoms get worse then he will call us back.  We will schedule to see a therapist in this office.

## 2017-10-19 NOTE — Telephone Encounter (Signed)
Patient called to repost increased depression symptoms. Patient would like a call back from you. [phone]

## 2017-10-26 ENCOUNTER — Other Ambulatory Visit (HOSPITAL_COMMUNITY): Payer: Self-pay | Admitting: Psychiatry

## 2017-10-26 DIAGNOSIS — F900 Attention-deficit hyperactivity disorder, predominantly inattentive type: Secondary | ICD-10-CM

## 2017-11-08 ENCOUNTER — Telehealth (HOSPITAL_COMMUNITY): Payer: Self-pay

## 2017-11-08 NOTE — Telephone Encounter (Signed)
Patient is requesting a refill on Methylphenidate 36mg  he is going out of town so the pharmacy he is requesting this med be sent to is [facility] in [place] [phone]. Please advise

## 2017-11-09 ENCOUNTER — Other Ambulatory Visit (HOSPITAL_COMMUNITY): Payer: Self-pay | Admitting: Psychiatry

## 2017-11-09 DIAGNOSIS — F900 Attention-deficit hyperactivity disorder, predominantly inattentive type: Secondary | ICD-10-CM

## 2017-11-09 MED ORDER — METHYLPHENIDATE HCL ER (OSM) 36 MG PO TBCR: 36.0000 mg | EXTENDED_RELEASE_TABLET | Freq: Every day | ORAL | 0 refills | Status: DC

## 2017-11-09 NOTE — Telephone Encounter (Signed)
Concerta 36 mg called in at [facility] in [place].  Phone #[phone]

## 2017-11-11 ENCOUNTER — Telehealth (HOSPITAL_COMMUNITY): Payer: Self-pay

## 2017-11-11 ENCOUNTER — Other Ambulatory Visit (HOSPITAL_COMMUNITY): Payer: Self-pay | Admitting: Psychiatry

## 2017-11-11 DIAGNOSIS — F900 Attention-deficit hyperactivity disorder, predominantly inattentive type: Secondary | ICD-10-CM

## 2017-11-11 MED ORDER — METHYLPHENIDATE HCL ER (OSM) 36 MG PO TBCR: 36.0000 mg | EXTENDED_RELEASE_TABLET | Freq: Every day | ORAL | 0 refills | Status: DC

## 2017-11-11 MED ORDER — CLONIDINE HCL 0.1 MG PO TABS: ORAL_TABLET | ORAL | 0 refills | Status: DC

## 2017-11-11 NOTE — Telephone Encounter (Signed)
Patient called, he would like his Concerta sent to the [facility] in [place] on [address] (pharmacy is in the chart) it was sent to the [facility] in [place], I can call and d/c that rx

## 2017-11-17 ENCOUNTER — Ambulatory Visit (INDEPENDENT_AMBULATORY_CARE_PROVIDER_SITE_OTHER): Payer: 59 | Admitting: Licensed Clinical Social Worker

## 2017-11-17 ENCOUNTER — Encounter

## 2017-11-17 ENCOUNTER — Encounter (HOSPITAL_COMMUNITY): Payer: Self-pay | Admitting: Licensed Clinical Social Worker

## 2017-11-17 DIAGNOSIS — F411 Generalized anxiety disorder: Secondary | ICD-10-CM | POA: Diagnosis not present

## 2017-11-17 NOTE — Progress Notes (Signed)
Comprehensive Clinical Assessment (CCA) Note  11/17/2017 James Peters [id number]  Visit Diagnosis:      ICD-10-CM   1. Generalized anxiety disorder F41.1       CCA Part One  Part One has been completed on paper by the patient.  (See scanned document in Chart Review)  CCA Part Two A  Intake/Chief Complaint:  CCA Intake With Chief Complaint CCA Part Two Date: 11/17/17 CCA Part Two Time: 1509 Chief Complaint/Presenting Problem: "I've started to notice that I'm losing the ability to feel joy in things that I've always loved. My friends told me to talk to someone". Patients Currently Reported Symptoms/Problems: loss of interest, stress from work, recent breakup, isolation, depression,  Collateral Involvement: na Individual's Strengths: Mentally tough, "I'm a positive person", I'm valued by others, loyal, relentless Individual's Preferences: individual counseling Individual's Abilities: able bodied Type of Services Patient Feels Are Needed: Individual counseling  Mental Health Symptoms Depression:  Depression: Change in energy/activity, Difficulty Concentrating, Weight gain/loss, Irritability(loss of interest)  Mania:     Anxiety:      Psychosis:     Trauma:     Obsessions:     Compulsions:     Inattention:     Hyperactivity/Impulsivity:     Oppositional/Defiant Behaviors:     Borderline Personality:     Other Mood/Personality Symptoms:      Mental Status Exam Appearance and self-care  Stature:  Stature: Tall  Weight:  Weight: Average weight  Clothing:  Clothing: Neat/clean  Grooming:  Grooming: Well-groomed  Cosmetic use:  Cosmetic Use: None  Posture/gait:  Posture/Gait: Normal  Motor activity:  Motor Activity: Not Remarkable  Sensorium  Attention:  Attention: Normal  Concentration:  Concentration: Normal  Orientation:  Orientation: X5  Recall/memory:  Recall/Memory: Normal  Affect and Mood  Affect:  Affect: Flat  Mood:  Mood: Depressed, Pessimistic  Relating   Eye contact:  Eye Contact: Normal  Facial expression:  Facial Expression: Responsive  Attitude toward examiner:  Attitude Toward Examiner: Cooperative  Thought and Language  Speech flow: Speech Flow: Normal  Thought content:  Thought Content: Appropriate to mood and circumstances  Preoccupation:     Hallucinations:     Organization:     Transport planner of Knowledge:  Fund of Knowledge: Average  Intelligence:  Intelligence: Average  Abstraction:  Abstraction: Psychologist, sport and exercise:  Judgement: Common-sensical  Reality Testing:  Reality Testing: Realistic  Insight:  Insight: Fair  Decision Making:  Decision Making: Normal  Social Functioning  Social Maturity:  Social Maturity: Responsible  Social Judgement:  Social Judgement: Normal  Stress  Stressors:  Stressors: Work(Romantic relationships)  Coping Ability:  Coping Ability: Deficient supports  Skill Deficits:     Supports:      Family and Psychosocial History: Family history Marital status: Single Are you sexually active?: Yes What is your sexual orientation?: heterosexual Does patient have children?: No  Childhood History:  Childhood History By whom was/is the patient raised?: Both parents Additional childhood history information: Parents married since 1985 Description of patient's relationship with caregiver when they were a child: "My parents tried really hard w/ my as a kid to help me through my ADHD, OCD-like Patient's description of current relationship with people who raised him/her: "Good, always been positive" How were you disciplined when you got in trouble as a child/adolescent?: "spanked by dad" Does patient have siblings?: Yes Number of Siblings: 1 Description of patient's current relationship with siblings: Older sister- "We don't talk a lot, we  used to despise each other to no end" Did patient suffer any verbal/emotional/physical/sexual abuse as a child?: No Did patient suffer from severe childhood  neglect?: No Has patient ever been sexually abused/assaulted/raped as an adolescent or adult?: No Was the patient ever a victim of a crime or a disaster?: No Witnessed domestic violence?: No Has patient been effected by domestic violence as an adult?: No  CCA Part Two B  Employment/Work Situation: Employment / Work Copywriter, advertising Employment situation: Employed Where is patient currently employed?: [employer] long has patient been employed?: 3 years Patient's job has been impacted by current illness: No What is the longest time patient has a held a job?: "Working since [employer], steadily" Has patient ever been in the [employer]?: No Are There Guns or Other Weapons in [place]?: No  Education: Education Last Grade Completed: 16 Name of [place]: [employer], [place] Did Teacher, adult education From [employer]?: Yes Did You Attend College?: Yes What Type of College Degree Do you Have?: BA [employer] Did You Attend Graduate School?: No Did You Have Any Special Interests In School?: Economics Did You Have An Individualized Education Program (IIEP): Yes Did You Have Any Difficulty At School?: Yes Were Any Medications Ever Prescribed For These Difficulties?: Yes Medications Prescribed For School Difficulties?: Concerta  Religion: Religion/Spirituality Are You A Religious Person?: Yes  Leisure/Recreation: Leisure / Recreation Leisure and Hobbies: [employer], Gym, playing and watching sports  Exercise/Diet: Exercise/Diet Do You Exercise?: Yes What Type of Exercise Do You Do?: Weight Training How Many Times a Week Do You Exercise?: 1-3 times a week Have You Gained or Lost A Significant Amount of Weight in the Past Six Months?: Yes-Gained Number of Pounds Gained: 10 Do You Follow a Special Diet?: No Do You Have Any Trouble Sleeping?: No  CCA Part Two C  Alcohol/Drug Use: Alcohol / Drug Use History of alcohol / drug use?: No history of alcohol / drug  abuse("May drink 2-3 beers a once or twice a month")                      CCA Part Three  ASAM's:  Six Dimensions of Multidimensional Assessment  Dimension 1:  Acute Intoxication and/or Withdrawal Potential:     Dimension 2:  Biomedical Conditions and Complications:     Dimension 3:  Emotional, Behavioral, or Cognitive Conditions and Complications:     Dimension 4:  Readiness to Change:     Dimension 5:  Relapse, Continued use, or Continued Problem Potential:     Dimension 6:  Recovery/Living Environment:      Substance use Disorder (SUD)    Social Function:  Social Functioning Social Maturity: Responsible Social Judgement: Normal  Stress:  Stress Stressors: Work(Romantic relationships) Coping Ability: Deficient supports Patient Takes Medications The Way The Doctor Instructed?: Yes Priority Risk: Low Acuity  Risk Assessment- Self-Harm Potential: Risk Assessment For Self-Harm Potential Thoughts of Self-Harm: No current thoughts Method: No plan  Risk Assessment -Dangerous to Others Potential: Risk Assessment For Dangerous to Others Potential Method: No Plan  DSM5 Diagnoses: Patient Active Problem List   Diagnosis Date Noted  . Overweight 07/19/2014  . ADHD (attention deficit hyperactivity disorder) 07/19/2014  . OCD (obsessive compulsive disorder) 07/19/2014    Patient Centered Plan: Patient is on the following Treatment Plan(s):  Anxiety  Recommendations for Services/Supports/Treatments: Recommendations for Services/Supports/Treatments Recommendations For Services/Supports/Treatments: Individual Therapy  Treatment Plan Summary: OP Treatment Plan Summary: "I have a long hx  of dealing w/ ADHD and lately I'm starting to feel depression, loss of finterest"  Referrals to Alternative Service(s): Referred to Alternative Service(s):   Place:   Date:   Time:    Referred to Alternative Service(s):   Place:   Date:   Time:    Referred to Alternative Service(s):    Place:   Date:   Time:    Referred to Alternative Service(s):   Place:   Date:   Time:     Archie Balboa

## 2017-12-09 ENCOUNTER — Ambulatory Visit (INDEPENDENT_AMBULATORY_CARE_PROVIDER_SITE_OTHER): Payer: 59 | Admitting: Licensed Clinical Social Worker

## 2017-12-09 ENCOUNTER — Other Ambulatory Visit (HOSPITAL_COMMUNITY): Payer: Self-pay | Admitting: Psychiatry

## 2017-12-09 ENCOUNTER — Telehealth (HOSPITAL_COMMUNITY): Payer: Self-pay

## 2017-12-09 DIAGNOSIS — F332 Major depressive disorder, recurrent severe without psychotic features: Secondary | ICD-10-CM

## 2017-12-09 DIAGNOSIS — F900 Attention-deficit hyperactivity disorder, predominantly inattentive type: Secondary | ICD-10-CM

## 2017-12-09 MED ORDER — METHYLPHENIDATE HCL ER (OSM) 36 MG PO TBCR
36.0000 mg | EXTENDED_RELEASE_TABLET | Freq: Every day | ORAL | 0 refills | Status: DC
Start: 2017-12-09 — End: 2017-12-31

## 2017-12-09 NOTE — Telephone Encounter (Signed)
Prescription called in at [facility].

## 2017-12-09 NOTE — Telephone Encounter (Signed)
Medication refill request - Patient in today to see therapist and requested a refill of Concerta, last provided 11/11/17.  Patient returns for next evaluaiton on 12/31/17 and verified [facility] as pharmacy.  Agreed to send request to Dr. Adele Schilder for refill as patient stated he has 2 days remaining.  Patient to call back if any problems getting refill.

## 2017-12-09 NOTE — Progress Notes (Signed)
   THERAPIST PROGRESS NOTE  Session Time: 9-10  Participation Level: Active  Behavioral Response: Casual and NeatAlertDepressed and Dysphoric  Type of Therapy: Individual Therapy  Treatment Goals addressed: Coping  Interventions: CBT, Strength-based and Supportive  Summary: James Peters is a 27 y.o. male who presents w/ depressed mood and blunt affect. He is slow of speech and tends to intellectualize his problems. He reports his depression has "kinda spiraled downward" and he was told by his boss to take today off to go to his appointments. He states his work is piling up from previous days and he is being called out for getting behind. Pt states he feels he "lost his sense of purpose" and "has no idea who he is anymore".  Pt discusses his current relationship status w/ dating James Peters while also having feelings for another woman, James Peters who is currently in the midst of a divorce and sleeping w/ other men besides the pt. Pt states James Peters makes him feel "invincible and gives me chills" but "his friends tell him to forget James Peters and move forward w/ James Peters. Pt states he feels horrible because he did sleep w/ James Peters while he was technically dating James Peters and he "never thought he would be that guy who cheats".   Pt acknowledges he needs to "re-evaluate his romantic goals" and wants to commit to "putting his best effort into making a relationship work w/ Teacher, English as a foreign language".   Pt asks for medication refill and this Probation officer coordinates w/ Beather Arbour on behalf of Dr. Adele Schilder to meet this request.  Suicidal/Homicidal: Nowithout intent/plan  Therapist Response: Counselor used open questions, validation, accurate empathy, values discussion, reflection of emotion and behavior, pychoeducation on depression sxs and their impact on the brain and functioning.   Plan: Return again in 2 weeks.  Diagnosis:    ICD-10-CM   1. Severe episode of recurrent major depressive disorder, without psychotic features  [facility]) F33.2       Archie Balboa, LCAS-A 12/09/2017

## 2017-12-31 ENCOUNTER — Encounter (HOSPITAL_COMMUNITY): Payer: Self-pay | Admitting: Psychiatry

## 2017-12-31 ENCOUNTER — Ambulatory Visit (INDEPENDENT_AMBULATORY_CARE_PROVIDER_SITE_OTHER): Payer: 59 | Admitting: Psychiatry

## 2017-12-31 DIAGNOSIS — F900 Attention-deficit hyperactivity disorder, predominantly inattentive type: Secondary | ICD-10-CM | POA: Diagnosis not present

## 2017-12-31 DIAGNOSIS — F411 Generalized anxiety disorder: Secondary | ICD-10-CM | POA: Diagnosis not present

## 2017-12-31 MED ORDER — VENLAFAXINE HCL ER 75 MG PO CP24: ORAL_CAPSULE | ORAL | 2 refills | Status: DC

## 2017-12-31 MED ORDER — CLONIDINE HCL 0.1 MG PO TABS: ORAL_TABLET | ORAL | 2 refills | Status: DC

## 2017-12-31 MED ORDER — TRAZODONE HCL 100 MG PO TABS: 100.0000 mg | ORAL_TABLET | Freq: Every day | ORAL | 2 refills | Status: DC

## 2017-12-31 MED ORDER — METHYLPHENIDATE HCL ER (OSM) 36 MG PO TBCR: 36.0000 mg | EXTENDED_RELEASE_TABLET | Freq: Every day | ORAL | 0 refills | Status: DC

## 2017-12-31 NOTE — Progress Notes (Signed)
BH MD/PA/NP OP Progress Note  12/31/2017 8:16 AM James Peters  MRN:  [id number]  Chief Complaint: I am feeling better now.  I was very sad after break-up.  HPI: James Peters came for  his follow-up appointment.  He was feeling depressed and sad after break-up in his relationship but now he is feeling better.  He is seeing Clayborn Bigness for therapy.  He is sleeping better.  He admitted weight gain since the last visit because he is not doing enough exercise or watching his calorie intake.  He like to go back to his old routine when he goes to gym every day.  He is stressed about his job.  Recently he applied for another job in [employer].  He denies any irritability, anger, mania, psychosis.  His attention and concentration is good.  He has no tremors shakes or any EPS.  Some nights he takes melatonin.  He is able to do multitasking and he does not feel any side effects from the medication.  Patient denies any paranoia, suicidal thoughts.  He denies any feeling of hopelessness or worthlessness.  He lives by himself.  He denies drinking or using any illegal substances  Visit Diagnosis:    ICD-10-CM   1. Generalized anxiety disorder F41.1 venlafaxine XR (EFFEXOR-XR) 75 MG 24 hr capsule    traZODone (DESYREL) 100 MG tablet  2. Attention deficit hyperactivity disorder (ADHD), predominantly inattentive type F90.0 cloNIDine (CATAPRES) 0.1 MG tablet    methylphenidate 36 MG PO CR tablet    Past Psychiatric History: Viewed. Patient diagnosed with ADHD when he was in the school. He hadpsychological testing. He istaking stimulantssinceschool age. In past few years he developed anxiety and he was given Klonopin and Paxil. He was seeing Metta Clines. Patient denies any history of psychiatric inpatient treatment, suicidal attempt, paranoia, hallucination, mania or any psychosishe took Wellbutrin for a while but it stopped working.  Past Medical History:  Past Medical History:  Diagnosis Date  .  Appendicitis     Past Surgical History:  Procedure Laterality Date  . APPENDECTOMY    . TONSILLECTOMY      Family Psychiatric History: Reviewed.  Family History: History reviewed. No pertinent family history.  Social History:  Social History   Socioeconomic History  . Marital status: Single    Spouse name: Not on file  . Number of children: Not on file  . Years of education: Not on file  . Highest education level: Not on file  Occupational History  . Not on file  Social Needs  . Financial resource strain: Not on file  . Food insecurity:    Worry: Not on file    Inability: Not on file  . Transportation needs:    Medical: Not on file    Non-medical: Not on file  Tobacco Use  . Smoking status: Never Smoker  . Smokeless tobacco: Never Used  Substance and Sexual Activity  . Alcohol use: No    Alcohol/week: 0.0 oz    Comment: Occasional use  . Drug use: No  . Sexual activity: Not Currently  Lifestyle  . Physical activity:    Days per week: Not on file    Minutes per session: Not on file  . Stress: Not on file  Relationships  . Social connections:    Talks on phone: Not on file    Gets together: Not on file    Attends religious service: Not on file    Active member of club or organization:  Not on file    Attends meetings of clubs or organizations: Not on file    Relationship status: Not on file  Other Topics Concern  . Not on file  Social History Narrative  . Not on file    Allergies: No Known Allergies  Metabolic Disorder Labs: Lab Results  Component Value Date   HGBA1C 5.1 06/21/2017   No results found for: PROLACTIN No results found for: CHOL, TRIG, HDL, CHOLHDL, VLDL, LDLCALC Lab Results  Component Value Date   TSH 1.290 06/21/2017   TSH 2.39 09/28/2015    Therapeutic Level Labs: No results found for: LITHIUM No results found for: VALPROATE No components found for:  CBMZ  Current Medications: Current Outpatient Medications  Medication Sig  Dispense Refill  . cloNIDine (CATAPRES) 0.1 MG tablet TAKE 1 TABLET(0.1 MG) BY MOUTH AT BEDTIME 30 tablet 0  . MELATONIN PO Take 20-25 mg by mouth.    . methylphenidate 36 MG PO CR tablet Take 1 tablet (36 mg total) by mouth daily. 30 tablet 0  . traZODone (DESYREL) 100 MG tablet Take 1 tablet (100 mg total) by mouth at bedtime. 30 tablet 2  . venlafaxine XR (EFFEXOR-XR) 75 MG 24 hr capsule Take one capsule daily 30 capsule 2   No current facility-administered medications for this visit.      Musculoskeletal: Strength & Muscle Tone: within normal limits Gait & Station: normal Patient leans: N/A  Psychiatric Specialty Exam: Review of Systems  Constitutional: Negative for weight loss.  Cardiovascular: Negative.  Negative for palpitations.  Skin: Negative.   Neurological: Negative for tremors.  Psychiatric/Behavioral: The patient is nervous/anxious.     Blood pressure 132/74, pulse 73, height 6\' 1"  (1.854 m), weight 238 lb (108 kg).Body mass index is 31.4 kg/m.  General Appearance: Casual  Eye Contact:  Good  Speech:  Clear and Coherent  Volume:  Normal  Mood:  Anxious  Affect:  Congruent  Thought Process:  Goal Directed  Orientation:  Full (Time, Place, and Person)  Thought Content: Logical   Suicidal Thoughts:  No  Homicidal Thoughts:  No  Memory:  Immediate;   Good Recent;   Good Remote;   Good  Judgement:  Good  Insight:  Good  Psychomotor Activity:  Normal  Concentration:  Concentration: Good and Attention Span: Good  Recall:  Good  Fund of Knowledge: Good  Language: Good  Akathisia:  No  Handed:  Right  AIMS (if indicated): not done  Assets:  Communication Skills Desire for Improvement Housing Resilience Social Support  ADL's:  Intact  Cognition: WNL  Sleep:  Good   Screenings: PHQ2-9     Office Visit from 09/28/2015 in Primary Care at [place] from 05/27/2015 in Primary Care at [facility] Total Score  0  0       Assessment and Plan:  Attention deficit disorder, inattentive type.  Generalized anxiety disorder.  Reassurance given.  Encouraged to continue therapy with [facility].  Patient like to continue his current medication.  He has no side effects.  I will continue Effexor XR 75 mg daily, clonidine 0.5 mg daily, trazodone 100 mg at bedtime and Concerta 36 mg daily.  Discussed medication side effects and benefits.  Discussed stimulant abuse, tolerance, withdrawal in detail.  Commended to call us back if he has any question or any concern.  Encourage healthy lifestyle and recommended to watch his calorie intake and do regular exercise.  Follow-up in 3 months.  Kathlee Nations,  MD 12/31/2017, 8:16 AM

## 2018-01-04 ENCOUNTER — Ambulatory Visit (INDEPENDENT_AMBULATORY_CARE_PROVIDER_SITE_OTHER): Payer: 59 | Admitting: Licensed Clinical Social Worker

## 2018-01-04 ENCOUNTER — Encounter (HOSPITAL_COMMUNITY): Payer: Self-pay | Admitting: Licensed Clinical Social Worker

## 2018-01-04 DIAGNOSIS — F900 Attention-deficit hyperactivity disorder, predominantly inattentive type: Secondary | ICD-10-CM | POA: Diagnosis not present

## 2018-01-04 DIAGNOSIS — F411 Generalized anxiety disorder: Secondary | ICD-10-CM

## 2018-01-04 NOTE — Progress Notes (Signed)
   THERAPIST PROGRESS NOTE  Session Time: 10-11  Participation Level: Active  Behavioral Response: Casual and Fairly GroomedAlertEuthymic  Type of Therapy: Individual Therapy  Treatment Goals addressed: Depression and Anxiety, increase insight into self efficacy and self care.  Interventions: CBT, Motivational Interviewing and Reframing  Summary: James Peters is a 27 y.o. male who presents with Depression and anxiety. He reports he has been feeling "ok". His girlfriend [name] broke up w/ his which "shocked him". He is no longer pursuing a relationship w/ [place] since she "does not seem interested in long term relationship". Pt reports he realizes how much he "Tried to control other people's perceptions of him" and how worried he was that he was not meeting people's expectations. Pt discussed, at length, his job duties and stressors. Pt feels overwhelmed when his work piles up and he is not able to dedicate proper time to each insurance case. Pt wants to work on his resume to submit for an internal promotion. Pt asks himself "what's next for him". Counselor reframes pt's question as "how can pt reinvent himself". Pt appears genuinely surprised by this and admits he has not thought of his "rock bottom as a new beginning until this moment". Pt is going to gym 3 times weekly and hopes to lose some weight. Counselor recommends an online personality test based on Meyers-Briggs Type test to help pt gain insight into his values and personality traits. Pt agrees this would be helpful. Pt feels he spent much of his life "wanting to get people to like him and now he wants to focus on living for his own reasons".  Suicidal/Homicidal: Nowithout intent/plan  Therapist Response: Counselor used open questions, discussed coping skills for improving communication at work and w/ romantic relationships, used active listening, validation, and reframing. Counselor recommended a personality test to learn more about  pt's self and desires.  Plan: Return again in 2 weeks.  Diagnosis:    ICD-10-CM   1. Generalized anxiety disorder F41.1   2. Attention deficit hyperactivity disorder (ADHD), predominantly inattentive type F90.0        Archie Balboa, LCAS-A 01/04/2018

## 2018-01-08 ENCOUNTER — Other Ambulatory Visit (HOSPITAL_COMMUNITY): Payer: Self-pay | Admitting: Psychiatry

## 2018-01-08 DIAGNOSIS — F411 Generalized anxiety disorder: Secondary | ICD-10-CM

## 2018-01-10 ENCOUNTER — Telehealth (HOSPITAL_COMMUNITY): Payer: Self-pay

## 2018-01-10 NOTE — Telephone Encounter (Signed)
Patient called requesting a refill on Methylphenidate 36mg . Next appointment is 04-01-18. Pharmacy is [facility] on [address], [place] [address]

## 2018-01-13 ENCOUNTER — Other Ambulatory Visit (HOSPITAL_COMMUNITY): Payer: Self-pay | Admitting: Psychiatry

## 2018-01-13 DIAGNOSIS — F900 Attention-deficit hyperactivity disorder, predominantly inattentive type: Secondary | ICD-10-CM

## 2018-01-13 NOTE — Telephone Encounter (Signed)
refill 

## 2018-01-13 NOTE — Telephone Encounter (Signed)
methylphenidate 36 MG PO CR tablet 30 tablet  Refills: 0  Start: 12/31/2017  End: 12/31/2018  Sig: Take 1 tablet (36 mg total) by mouth daily.  Route: Oral  Note to Pharmacy: Do not fill until 01/11/18  Earliest Fill Date: 12/31/2017   Pt has a script that was not due to filled until after 01/11/18. He should not need a new script yet

## 2018-01-18 ENCOUNTER — Ambulatory Visit (INDEPENDENT_AMBULATORY_CARE_PROVIDER_SITE_OTHER): Payer: 59 | Admitting: Licensed Clinical Social Worker

## 2018-01-18 ENCOUNTER — Encounter (HOSPITAL_COMMUNITY): Payer: Self-pay | Admitting: Licensed Clinical Social Worker

## 2018-01-18 DIAGNOSIS — F411 Generalized anxiety disorder: Secondary | ICD-10-CM

## 2018-01-18 NOTE — Progress Notes (Signed)
   THERAPIST PROGRESS NOTE  Session Time: 4-5  Participation Level: Active  Behavioral Response: Casual and NeatAlertEuthymic  Type of Therapy: Individual Therapy  Treatment Goals addressed: Anxiety  Interventions: CBT and Supportive  Summary: James Peters is a 26 y.o. male who presents with hx of anxiety. He reports he has started dating a new woman since July 4 and is "overall happy". He discusses his past romances and his hx of trying to control outcomes and perceptions w/ bad results. Counselor and pt spend time gaining insight into pt's old relationship patterns, his need for external validation, and pressure he puts on himself to change other people. Counselor asks pt to consider "going w/ the flow" of relationships more. Pt agrees and states he feels validated and "less pressured" to perform in relationships now.   Suicidal/Homicidal: Nowithout intent/plan  Therapist Response: Counselor used open socratic questioning to help build insight into pts controlling bxs. Pt appears to seek external validation and states he "probably picked it up from sports as a teen" when he would blame himself when the team would not win.   Plan: Return again in 2 weeks.  Diagnosis:    ICD-10-CM   1. Generalized anxiety disorder F41.Polson Hagar Sadiq, LCAS-A 01/18/2018

## 2018-02-08 ENCOUNTER — Ambulatory Visit (INDEPENDENT_AMBULATORY_CARE_PROVIDER_SITE_OTHER): Payer: 59 | Admitting: Licensed Clinical Social Worker

## 2018-02-08 ENCOUNTER — Encounter (HOSPITAL_COMMUNITY): Payer: Self-pay | Admitting: Licensed Clinical Social Worker

## 2018-02-08 DIAGNOSIS — F411 Generalized anxiety disorder: Secondary | ICD-10-CM | POA: Diagnosis not present

## 2018-02-08 NOTE — Progress Notes (Signed)
   THERAPIST PROGRESS NOTE  Session Time: 10-11  Participation Level: Active  Behavioral Response: Casual and Well GroomedAlertEuthymic  Type of Therapy: Individual Therapy  Treatment Goals addressed: Anxiety  Interventions: CBT, Strength-based, Supportive and Reframing  Summary: James Peters is a 27 y.o. male who presents with hx of depressive mood and GAD. He reports his work is causing him stress since he got "Critical feedback" yesterday that he is not answering his phone enough. Discussed reasons that pt is avoiding work. Determined primary reason to be lack of inspiration by job and his supervisor's distrust of his leadership. Pt is interviewing for new job w/i the company and is open to finding new employment. 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    Suicidal/Homicidal: Nowithout intent/plan  Therapist Response: Counselor used open questions, active listening, empathy and reflection. Counselor assessed pt level of functioning and helped pt identify possible reframes for recent negative interpretations he was having. Discussed solution-focused "ideal job" questions to help pt gain insight into his real passion.  Plan: Return again in 2 weeks.  Diagnosis:    ICD-10-CM   1. Generalized anxiety disorder F41.Vanderbilt Swan, LCAS-A 02/08/2018

## 2018-02-10 ENCOUNTER — Telehealth (HOSPITAL_COMMUNITY): Payer: Self-pay

## 2018-02-10 DIAGNOSIS — F900 Attention-deficit hyperactivity disorder, predominantly inattentive type: Secondary | ICD-10-CM

## 2018-02-10 MED ORDER — METHYLPHENIDATE HCL ER (OSM) 36 MG PO TBCR: 36.0000 mg | EXTENDED_RELEASE_TABLET | Freq: Every day | ORAL | 0 refills | Status: DC

## 2018-02-10 NOTE — Telephone Encounter (Signed)
This is an Arfeen patient, he is calling requesting a refill on Methylphenidate. Patient uses [facility] in [place], thank you

## 2018-02-10 NOTE — Telephone Encounter (Signed)
All set!

## 2018-02-28 ENCOUNTER — Ambulatory Visit (HOSPITAL_COMMUNITY): Payer: Self-pay | Admitting: Licensed Clinical Social Worker

## 2018-03-14 ENCOUNTER — Telehealth (HOSPITAL_COMMUNITY): Payer: Self-pay

## 2018-03-14 ENCOUNTER — Other Ambulatory Visit (HOSPITAL_COMMUNITY): Payer: Self-pay | Admitting: Psychiatry

## 2018-03-14 DIAGNOSIS — F900 Attention-deficit hyperactivity disorder, predominantly inattentive type: Secondary | ICD-10-CM

## 2018-03-14 MED ORDER — METHYLPHENIDATE HCL ER (OSM) 36 MG PO TBCR: 36.0000 mg | EXTENDED_RELEASE_TABLET | Freq: Every day | ORAL | 0 refills | Status: DC

## 2018-03-14 NOTE — Telephone Encounter (Signed)
Okay, patient lives in [place], I will send this message to Dr. Modesta Messing and see if she can send it. Thank you!

## 2018-03-14 NOTE — Telephone Encounter (Signed)
Ordered for a month.

## 2018-03-14 NOTE — Telephone Encounter (Signed)
I can print but not send. My phone app will be fixed by [place]

## 2018-03-14 NOTE — Telephone Encounter (Signed)
Arfeen patient:   Patient has a follow up later this month, he is calling for a refill on Concerta 36 mg. Patient uses [facility] in [place]. Please review and advise, thank you

## 2018-03-17 ENCOUNTER — Ambulatory Visit (INDEPENDENT_AMBULATORY_CARE_PROVIDER_SITE_OTHER): Payer: 59 | Admitting: Licensed Clinical Social Worker

## 2018-03-17 ENCOUNTER — Encounter (HOSPITAL_COMMUNITY): Payer: Self-pay | Admitting: Licensed Clinical Social Worker

## 2018-03-17 DIAGNOSIS — F411 Generalized anxiety disorder: Secondary | ICD-10-CM | POA: Diagnosis not present

## 2018-03-17 DIAGNOSIS — F900 Attention-deficit hyperactivity disorder, predominantly inattentive type: Secondary | ICD-10-CM

## 2018-03-17 NOTE — Progress Notes (Signed)
   THERAPIST PROGRESS NOTE  Session Time: 9-10  Participation Level: Active  Behavioral Response: Neat and Well GroomedAlertEuthymic  Type of Therapy: Individual Therapy  Treatment Goals addressed: Anxiety  Interventions: CBT  Summary: James Peters is a 27 y.o. male who presents with GAD w/ current mild depressive thoughts.  Subjective: " I finally have a romantic relationship that I really feel is genuine. Work is so stressful right now and I'm glad I at least have a healthy girlfriend who cares about me."  Pt is engaged, soft spoken, makes good eye contact, has open body posture. His affect is blunted, almost flat. His mood is down. He reports his work is stressful since he did not get the promotion he applied for. he is currently pursuing other jobs and feels it would be best to change companies since he does not feel supported by his supervisor. Counselor and pt discuss communication strategies for improving pt's work relationships and his ability to communicate his ideas of change to his supervisors. Pt reports mild depressive thoughts on his way to work but reports that his S/O texts him encouraging messages which helps him work better.   Suicidal/Homicidal: Nowithout intent/plan  Therapist Response: Counselor used open questions, active listening, communication and assertiveness coaching, and encouragement. Counselor validated pt's struggle to find meaningful and supportive work. Counselor reframed pt's consistently negative view of his current circumstances. Pt appears to be growing in his ability to find meaning in his relationships while not relying too much on others for validation. Pt is concerned about getting tested for OCD and ADHD to see how that is still impacting him.  Plan: Return again in 2 weeks.  Diagnosis:    ICD-10-CM   1. Attention deficit hyperactivity disorder (ADHD), predominantly inattentive type F90.0   2. Generalized anxiety disorder F41.[place] James Peters, LCAS-A 03/17/2018

## 2018-03-21 ENCOUNTER — Encounter (HOSPITAL_COMMUNITY): Payer: Self-pay | Admitting: Psychiatry

## 2018-03-21 ENCOUNTER — Ambulatory Visit (INDEPENDENT_AMBULATORY_CARE_PROVIDER_SITE_OTHER): Payer: 59 | Admitting: Psychiatry

## 2018-03-21 DIAGNOSIS — Z79899 Other long term (current) drug therapy: Secondary | ICD-10-CM | POA: Diagnosis not present

## 2018-03-21 DIAGNOSIS — F900 Attention-deficit hyperactivity disorder, predominantly inattentive type: Secondary | ICD-10-CM | POA: Diagnosis not present

## 2018-03-21 DIAGNOSIS — F411 Generalized anxiety disorder: Secondary | ICD-10-CM

## 2018-03-21 MED ORDER — METHYLPHENIDATE HCL ER (OSM) 36 MG PO TBCR: 36.0000 mg | EXTENDED_RELEASE_TABLET | Freq: Every day | ORAL | 0 refills | Status: DC

## 2018-03-21 MED ORDER — CLONIDINE HCL 0.1 MG PO TABS: ORAL_TABLET | ORAL | 2 refills | Status: AC

## 2018-03-21 MED ORDER — VENLAFAXINE HCL ER 75 MG PO CP24: ORAL_CAPSULE | ORAL | 2 refills | Status: DC

## 2018-03-21 MED ORDER — METHYLPHENIDATE HCL ER (OSM) 36 MG PO TBCR: 36.0000 mg | EXTENDED_RELEASE_TABLET | Freq: Every day | ORAL | 0 refills | Status: AC

## 2018-03-21 MED ORDER — TRAZODONE HCL 100 MG PO TABS: 100.0000 mg | ORAL_TABLET | Freq: Every day | ORAL | 2 refills | Status: AC

## 2018-03-21 NOTE — Progress Notes (Signed)
[place] MD/PA/NP OP Progress Note  03/21/2018 8:34 AM James Peters  MRN:  [id number]  Chief Complaint: I am under a lot of stress.  I am thinking about switching my job.  HPI: Patient came for his follow-up appointment.  Continues to struggle with his anxiety symptoms.  He is having a hard time at work.  Though his supervisor helped him and giving him tools so he can do his work on time but he believes he already have so much work that he going to always behind his task.  He is seriously thinking to switch his job.  He admitted not started regular gym and gained weight.  His blood pressure is also slightly increase from the last time.  He like to redo psychological testing to see his ADD symptoms.  He denies any irritability, anger, mania, psychosis.  He has no tremors or any shakes.  He is sleeping better and take some time melatonin.  He is compliant with trazodone, clonidine, Effexor and Concerta.  He is happy that he started a new relationship which is going very well.  He denies drinking or using any illegal substances.  Denies any feelings of hopelessness or worthlessness or any suicidal thoughts.  He lives by himself.    Visit Diagnosis:    ICD-10-CM   1. Generalized anxiety disorder F41.1 venlafaxine XR (EFFEXOR-XR) 75 MG 24 hr capsule    traZODone (DESYREL) 100 MG tablet  2. Attention deficit hyperactivity disorder (ADHD), predominantly inattentive type F90.0 cloNIDine (CATAPRES) 0.1 MG tablet    methylphenidate 36 MG PO CR tablet    DISCONTINUED: methylphenidate 36 MG PO CR tablet    Past Psychiatric History: Reviewed. Patient diagnosed with ADHD when he was in the school. He hadpsychological testing. He istaking stimulantssinceschool age. In past few years he developed anxiety and he was given Klonopin and Paxil. He was seeing Metta Clines. Patient denies any history of psychiatric inpatient treatment, suicidal attempt, paranoia, hallucination, mania or any psychosishe took  Wellbutrin for a while but it stopped working.  Past Medical History:  Past Medical History:  Diagnosis Date  . Appendicitis     Past Surgical History:  Procedure Laterality Date  . APPENDECTOMY    . TONSILLECTOMY      Family Psychiatric History: Reviewed.  Family History: No family history on file.  Social History:  Social History   Socioeconomic History  . Marital status: Single    Spouse name: Not on file  . Number of children: Not on file  . Years of education: Not on file  . Highest education level: Not on file  Occupational History  . Not on file  Social Needs  . Financial resource strain: Not on file  . Food insecurity:    Worry: Not on file    Inability: Not on file  . Transportation needs:    Medical: Not on file    Non-medical: Not on file  Tobacco Use  . Smoking status: Never Smoker  . Smokeless tobacco: Never Used  Substance and Sexual Activity  . Alcohol use: No    Alcohol/week: 0.0 standard drinks    Comment: Occasional use  . Drug use: No  . Sexual activity: Not Currently  Lifestyle  . Physical activity:    Days per week: Not on file    Minutes per session: Not on file  . Stress: Not on file  Relationships  . Social connections:    Talks on phone: Not on file    Gets  together: Not on file    Attends religious service: Not on file    Active member of club or organization: Not on file    Attends meetings of clubs or organizations: Not on file    Relationship status: Not on file  Other Topics Concern  . Not on file  Social History Narrative  . Not on file    Allergies: No Known Allergies  Metabolic Disorder Labs: Lab Results  Component Value Date   HGBA1C 5.1 06/21/2017   No results found for: PROLACTIN No results found for: CHOL, TRIG, HDL, CHOLHDL, VLDL, LDLCALC Lab Results  Component Value Date   TSH 1.290 06/21/2017   TSH 2.39 09/28/2015    Therapeutic Level Labs: No results found for: LITHIUM No results found for:  VALPROATE No components found for:  CBMZ  Current Medications: Current Outpatient Medications  Medication Sig Dispense Refill  . cloNIDine (CATAPRES) 0.1 MG tablet TAKE 1 TABLET(0.1 MG) BY MOUTH AT BEDTIME 30 tablet 2  . MELATONIN PO Take 20-25 mg by mouth.    . methylphenidate 36 MG PO CR tablet Take 1 tablet (36 mg total) by mouth daily. 30 tablet 0  . traZODone (DESYREL) 100 MG tablet Take 1 tablet (100 mg total) by mouth at bedtime. 30 tablet 2  . venlafaxine XR (EFFEXOR-XR) 75 MG 24 hr capsule Take one capsule daily 30 capsule 2   No current facility-administered medications for this visit.      Musculoskeletal: Strength & Muscle Tone: within normal limits Gait & Station: normal Patient leans: N/A  Psychiatric Specialty Exam: ROS  Blood pressure 136/90, pulse 80, height 6\' 1"  (1.854 m), weight 250 lb (113.4 kg).Body mass index is 32.98 kg/m.  General Appearance: Casual  Eye Contact:  Good  Speech:  Normal Rate  Volume:  Normal  Mood:  Anxious  Affect:  Congruent  Thought Process:  Goal Directed  Orientation:  Full (Time, Place, and Person)  Thought Content: Logical   Suicidal Thoughts:  No  Homicidal Thoughts:  No  Memory:  Immediate;   Good Recent;   Good Remote;   Good  Judgement:  Good  Insight:  Good  Psychomotor Activity:  Normal  Concentration:  Concentration: Fair and Attention Span: Fair  Recall:  Good  Fund of Knowledge: Good  Language: Good  Akathisia:  No  Handed:  Right  AIMS (if indicated): not done  Assets:  Communication Skills Desire for Improvement Housing Resilience  ADL's:  Intact  Cognition: WNL  Sleep:  Good   Screenings: PHQ2-9     Office Visit from 09/28/2015 in Primary Care at [place] from 05/27/2015 in Primary Care at [facility] Total Score  0  0       Assessment and Plan: Attention deficit disorder, inattentive type.  Generalized anxiety disorder.  We discussed weight loss and started going to gym to ease  his anxiety and nervousness.  He is thinking to switch his job which may help him a lot.  We discussed medication side effects especially stimulant can cause further blood pressure and anxiety.  He does not want to change his medication.  Encouraged to continue counseling with a therapist in this office.  Patient like to redo his psychological testing and will refer him for that.  I recommended to call us back if he has any question or any concern.  Discussed stimulant abuse tolerance and withdrawal.  Continue trazodone 100 mg at bedtime, Concerta 36 mg daily, clonidine 0.5 mg  at bedtime and Effexor extended release 75 mg daily.  I will see him again in 3 months.   Kathlee Nations, MD 03/21/2018, 8:34 AM

## 2018-04-01 ENCOUNTER — Ambulatory Visit (HOSPITAL_COMMUNITY): Payer: Self-pay | Admitting: Psychiatry

## 2018-04-05 ENCOUNTER — Other Ambulatory Visit (HOSPITAL_COMMUNITY): Payer: Self-pay | Admitting: Psychiatry

## 2018-04-05 ENCOUNTER — Telehealth (HOSPITAL_COMMUNITY): Payer: Self-pay

## 2018-04-05 NOTE — Telephone Encounter (Signed)
Patient called with concerns about his B/P so he is thinking if he takes 1/2 of his Effexor could that possibly help his situation. Please review and advise

## 2018-04-05 NOTE — Telephone Encounter (Signed)
He can try lowering his Effexor but his anxiety may get worse.  We need to know how is his blood pressure readings.  If he has any chest pain, headaches that he should see his medical doctor.  He can also try lowering his Concerta that can help reduce his blood pressure.

## 2018-04-06 NOTE — Telephone Encounter (Signed)
Called and spoke with patient he said that his B/P has been "all over the place" some of the readings has been 170/80 and 180/70. He said that first he wants to try lowering his Effexor, then if he still needs to he will lower his Clonidine 01.mg tabs. Will call back if he has anymore concerns

## 2018-04-07 ENCOUNTER — Other Ambulatory Visit (HOSPITAL_COMMUNITY): Payer: Self-pay

## 2018-04-07 MED ORDER — VENLAFAXINE HCL ER 37.5 MG PO CP24: 37.5000 mg | ORAL_CAPSULE | Freq: Every day | ORAL | 0 refills | Status: DC

## 2018-04-07 NOTE — Telephone Encounter (Signed)
Patient called back, the 37.5 mg of Effexor was not sent into the pharmacy, I sent in a one month supply and patient will have follow up.

## 2018-04-14 ENCOUNTER — Other Ambulatory Visit (HOSPITAL_COMMUNITY): Payer: Self-pay | Admitting: Psychiatry

## 2018-04-14 DIAGNOSIS — F411 Generalized anxiety disorder: Secondary | ICD-10-CM

## 2018-04-19 ENCOUNTER — Ambulatory Visit (HOSPITAL_COMMUNITY): Payer: Self-pay | Admitting: Licensed Clinical Social Worker

## 2018-04-20 ENCOUNTER — Telehealth (HOSPITAL_COMMUNITY): Payer: Self-pay

## 2018-04-20 NOTE — Telephone Encounter (Signed)
Patient is calling to see if he can stop the clonodine at night since he is taking Trazodone - he is feeling too groggy in the morning. Please review and advise, thank you

## 2018-04-21 NOTE — Telephone Encounter (Signed)
Called patient back and let him know, patient voiced his understanding

## 2018-04-21 NOTE — Telephone Encounter (Signed)
He has he can stop clonidine.

## 2018-05-06 ENCOUNTER — Other Ambulatory Visit (HOSPITAL_COMMUNITY): Payer: Self-pay

## 2018-05-06 MED ORDER — VENLAFAXINE HCL ER 37.5 MG PO CP24: 37.5000 mg | ORAL_CAPSULE | Freq: Every day | ORAL | 0 refills | Status: DC

## 2018-05-14 ENCOUNTER — Other Ambulatory Visit (HOSPITAL_COMMUNITY): Payer: Self-pay | Admitting: Psychiatry

## 2018-05-14 DIAGNOSIS — F411 Generalized anxiety disorder: Secondary | ICD-10-CM

## 2018-05-25 ENCOUNTER — Telehealth (HOSPITAL_COMMUNITY): Payer: Self-pay

## 2018-05-25 NOTE — Telephone Encounter (Signed)
Patient is calling to report that he is having a hard time focusing at work and spacing out a lot. He has a follow up next month but wants to know if the medication can be adjusted

## 2018-05-26 NOTE — Telephone Encounter (Signed)
Called patient and he decided that he does not want to take the Clonidine right now, he will wait for his appointment

## 2018-05-26 NOTE — Telephone Encounter (Signed)
He may consider restarting clonidine which he was taking but then he stopped because of grogginess.  If he started to have grogginess again with the clonidine he can reduce trazodone to 50 mg.  We will discuss about his stimulant dose on his next appointment.

## 2018-06-06 ENCOUNTER — Other Ambulatory Visit (HOSPITAL_COMMUNITY): Payer: Self-pay

## 2018-06-06 MED ORDER — VENLAFAXINE HCL ER 37.5 MG PO CP24: 37.5000 mg | ORAL_CAPSULE | Freq: Every day | ORAL | 0 refills | Status: AC

## 2018-06-20 ENCOUNTER — Ambulatory Visit (HOSPITAL_COMMUNITY): Payer: Self-pay | Admitting: Psychiatry

## 2018-07-15 ENCOUNTER — Ambulatory Visit: Payer: 59 | Admitting: Psychology
